# Patient Record
Sex: Male | Born: 1965 | Race: White | Hispanic: No | Marital: Married | Smoking: Never smoker
Health system: Southern US, Community
[De-identification: ages and names within clinical notes are randomized; demographics above are authoritative.]

## PROBLEM LIST (undated history)

## (undated) DIAGNOSIS — I2510 Atherosclerotic heart disease of native coronary artery without angina pectoris: Secondary | ICD-10-CM

## (undated) DIAGNOSIS — K219 Gastro-esophageal reflux disease without esophagitis: Secondary | ICD-10-CM

## (undated) DIAGNOSIS — I10 Essential (primary) hypertension: Secondary | ICD-10-CM

## (undated) DIAGNOSIS — E785 Hyperlipidemia, unspecified: Secondary | ICD-10-CM

## (undated) DIAGNOSIS — I209 Angina pectoris, unspecified: Secondary | ICD-10-CM

## (undated) HISTORY — DX: Hyperlipidemia, unspecified: E78.5

## (undated) HISTORY — DX: Gastro-esophageal reflux disease without esophagitis: K21.9

## (undated) HISTORY — PX: MASTECTOMY: SHX3

---

## 1994-11-29 HISTORY — PX: ANTERIOR CRUCIATE LIGAMENT REPAIR: SHX115

## 2009-06-18 ENCOUNTER — Ambulatory Visit: Payer: Self-pay | Admitting: Internal Medicine

## 2009-06-22 LAB — CONVERTED CEMR LAB
Basophils Relative: 0.8 % (ref 0.0–3.0)
Eosinophils Relative: 3.7 % (ref 0.0–5.0)
HCT: 42.4 % (ref 39.0–52.0)
Lymphs Abs: 1.4 10*3/uL (ref 0.7–4.0)
MCHC: 34.6 g/dL (ref 30.0–36.0)
MCV: 88.5 fL (ref 78.0–100.0)
Monocytes Absolute: 0.5 10*3/uL (ref 0.1–1.0)
Platelets: 190 10*3/uL (ref 150.0–400.0)
WBC: 5.5 10*3/uL (ref 4.5–10.5)

## 2009-06-23 ENCOUNTER — Ambulatory Visit: Payer: Self-pay | Admitting: Internal Medicine

## 2009-06-23 ENCOUNTER — Encounter (INDEPENDENT_AMBULATORY_CARE_PROVIDER_SITE_OTHER): Payer: Self-pay | Admitting: *Deleted

## 2009-06-30 ENCOUNTER — Telehealth (INDEPENDENT_AMBULATORY_CARE_PROVIDER_SITE_OTHER): Payer: Self-pay | Admitting: *Deleted

## 2009-07-28 ENCOUNTER — Encounter (INDEPENDENT_AMBULATORY_CARE_PROVIDER_SITE_OTHER): Payer: Self-pay | Admitting: *Deleted

## 2011-01-18 ENCOUNTER — Institutional Professional Consult (permissible substitution) (INDEPENDENT_AMBULATORY_CARE_PROVIDER_SITE_OTHER): Payer: BC Managed Care – PPO | Admitting: Cardiology

## 2011-01-18 DIAGNOSIS — E785 Hyperlipidemia, unspecified: Secondary | ICD-10-CM

## 2015-05-27 ENCOUNTER — Telehealth: Payer: Self-pay | Admitting: Cardiology

## 2015-05-27 NOTE — Telephone Encounter (Signed)
Ingrid from [facility] was calling to see if the pt could be seen sooner than 9/8 because he was in the office with some exertional chest pain. Please f/u with her   Thanks

## 2015-05-28 ENCOUNTER — Encounter: Payer: Self-pay | Admitting: Interventional Cardiology

## 2015-05-28 ENCOUNTER — Ambulatory Visit (INDEPENDENT_AMBULATORY_CARE_PROVIDER_SITE_OTHER): Payer: BLUE CROSS/BLUE SHIELD | Admitting: Interventional Cardiology

## 2015-05-28 VITALS — BP 128/84 | HR 95 | Ht 72.0 in | Wt 204.8 lb

## 2015-05-28 DIAGNOSIS — K219 Gastro-esophageal reflux disease without esophagitis: Secondary | ICD-10-CM | POA: Diagnosis not present

## 2015-05-28 DIAGNOSIS — I10 Essential (primary) hypertension: Secondary | ICD-10-CM | POA: Diagnosis not present

## 2015-05-28 DIAGNOSIS — R079 Chest pain, unspecified: Secondary | ICD-10-CM

## 2015-05-28 NOTE — Telephone Encounter (Signed)
Returned call to patient no answer.LMTC. 

## 2015-05-28 NOTE — Patient Instructions (Signed)
Medication Instructions:  None  Labwork: None  Testing/Procedures: Your physician has requested that you have a stress echocardiogram. For further information please visit [email or web address]. Please follow instruction sheet as given.   Follow-Up: Your follow up will be based on the results of your stress echocardiogram.   Any Other Special Instructions Will Be Listed Below (If Applicable).

## 2015-05-28 NOTE — Progress Notes (Signed)
Patient ID: Marvin Soto, male   DOB: 30-Sep-1966, 49 y.o.   MRN: [id number]     Cardiology Office Note   Date:  05/28/2015   ID:  Marvin Soto, DOB 30-Sep-1966, MRN [id number]  PCP:  Sissy HoffSWAYNE,DAVID W, MD    No chief complaint on file. chest discomfort   Wt Readings from Last 3 Encounters:  05/28/15 204 lb 12.8 oz (92.897 kg)  06/18/09 208 lb 12.8 oz (94.711 kg)       History of Present Illness: Marvin ShipKeith Skalla is a 49 y.o. male  Who has had HTN.  ARB was increased.  He had a stress echo 10 years ago which was normal.  H ehad dizziness at that time.  His father had an MI at 1046, he had a stent later.  He was a heavy smoker.  He has a few sisters, but no heart issues in their histories.    He has had chest tightness/burning intermittently for a few months.  It occurs when walking and carrying something heavy.  Resolves with rest. He has had some diaphoresis when he has the chest tightness.  He stops walking and it goes away.    He started Vyvanse for ADD in the fall.  The chest pain does not correlate with the start of the medication.  He lost weight with this medication.    In Dec 2015, he hikes [employer] and did not have any chest discomfort.  He feels ok ayaking.  Last performed 4 days ago.     Past Medical History  Diagnosis Date  . GERD (gastroesophageal reflux disease)   . Dyslipidemia     Past Surgical History  Procedure Laterality Date  . Anterior cruciate ligament repair    . Mastectomy       Current Outpatient Prescriptions  Medication Sig Dispense Refill  . Cetirizine HCl (ZYRTEC ALLERGY PO) Take 1 tablet by mouth daily.     Marland Kitchen. losartan (COZAAR) 100 MG tablet Take 100 mg by mouth daily.  5  . VYVANSE 40 MG capsule Take 40 mg by mouth daily.   0   No current facility-administered medications for this visit.    Allergies:   Review of patient's allergies indicates no known allergies.    Social History:  The patient  reports that he has never smoked. He has  never used smokeless tobacco. He reports that he does not drink alcohol.   Family History:  The patient's *family history includes Healthy in his mother, sister, and sister; Heart attack in his father; Multiple sclerosis in his sister. +early CAD.   ROS:  Please see the history of present illness.   Otherwise, review of systems are positive for chest discomfort.   All other systems are reviewed and negative.    PHYSICAL EXAM: VS:  BP 128/84 mmHg  Pulse 95  Ht 6' (1.829 m)  Wt 204 lb 12.8 oz (92.897 kg)  BMI 27.77 kg/m2  SpO2 99% , BMI Body mass index is 27.77 kg/(m^2). GEN: Well nourished, well developed, in no acute distress HEENT: normal Neck: no JVD, carotid bruits, or masses Cardiac: RRR; no murmurs, rubs, or gallops,no edema  Respiratory:  clear to auscultation bilaterally, normal work of breathing GI: soft, nontender, nondistended, + BS MS: no deformity or atrophy, left knee scar Skin: warm and dry, no rash Neuro:  Strength and sensation are intact Psych: euthymic mood, full affect   EKG:   The ekg  demonstrates normal, done by PMD   Recent Labs:  No results found for requested labs within last 365 days.   Lipid Panel No results found for: CHOL, TRIG, HDL, CHOLHDL, VLDL, LDLCALC, LDLDIRECT   Other studies Reviewed: Additional studies/ records that were reviewed today with results demonstrating: old ECG, unable to find old stress test result.   ASSESSMENT AND PLAN:   1. Chest discomfort: Plan stress echocardiogram.  Reasonable to evaluate him for ischemia given his family history of heart disease.  We did briefly discuss angiography given that his symptoms do not occur every time he exerts himself, we opted for stress testing. If the stress test is abnormal, would need to consider angiography at that point. 2. Hypertension: Well controlled on the losartan. This is a good choice given his risk for cardiovascular disease. 3. Dyslipidemia: will obtain most recent lipid  tests from his primary care doctor. Currently controlled with diet and exercise. 4. GERD: Controlled by Nexium. Exertional symptoms did not go away. I doubt that what he is experiencing when walking and carrying something is related to reflux. 5. ADD: Unclear whether this could be playing a part in his cardiac sx.  Watch for arhythmia on the treadmill.  CP started after Vyvanse.   Current medicines are reviewed at length with the patient today.  The patient concerns regarding his medicines were addressed.  The following changes have been made:  No change  Labs/ tests ordered today include: stress echo No orders of the defined types were placed in this encounter.    Recommend 150 minutes/week of aerobic exercise Low fat, low carb, high fiber diet recommended  Disposition:   FU for stress echo   Delorise JacksonSigned, Salley Boxley S., MD  05/28/2015 11:02 AM    [facility] [address], [place], [place]  [address] Phone: [phone]; Fax: [phone]

## 2015-05-30 NOTE — Telephone Encounter (Signed)
Did you speak with Jerene Dillingngrid?

## 2015-05-30 NOTE — Telephone Encounter (Signed)
Returned call to patient he stated someone already called him and he already saw Dr.Varanasi 05/28/15.

## 2015-06-04 ENCOUNTER — Telehealth (HOSPITAL_COMMUNITY): Payer: Self-pay | Admitting: *Deleted

## 2015-06-04 NOTE — Telephone Encounter (Signed)
Left message on voicemail in reference to upcoming appointment scheduled for 06/06/15. Phone number given for a call back so details instructions can be given. Rashi Giuliani J Didi Ganaway, RN 

## 2015-06-04 NOTE — Telephone Encounter (Signed)
Patient given detailed instructions per Stress Test Requisition Sheet for test on 06/06/15 at 730.Patient Notified to arrive 30 minutes early, and that it is imperative to arrive on time for appointment to keep from having the test rescheduled.  Patient verbalized understanding. Antionette CharMary J Emile Ringgenberg, RN

## 2015-06-06 ENCOUNTER — Other Ambulatory Visit (HOSPITAL_COMMUNITY): Payer: BLUE CROSS/BLUE SHIELD

## 2015-06-06 ENCOUNTER — Telehealth: Payer: Self-pay | Admitting: Interventional Cardiology

## 2015-06-06 ENCOUNTER — Ambulatory Visit (HOSPITAL_COMMUNITY): Payer: BLUE CROSS/BLUE SHIELD | Attending: Cardiovascular Disease

## 2015-06-06 ENCOUNTER — Ambulatory Visit (HOSPITAL_BASED_OUTPATIENT_CLINIC_OR_DEPARTMENT_OTHER): Payer: BLUE CROSS/BLUE SHIELD

## 2015-06-06 DIAGNOSIS — R079 Chest pain, unspecified: Secondary | ICD-10-CM | POA: Diagnosis not present

## 2015-06-06 DIAGNOSIS — R9439 Abnormal result of other cardiovascular function study: Secondary | ICD-10-CM | POA: Insufficient documentation

## 2015-06-06 NOTE — Telephone Encounter (Signed)
New Message   Pt is calling because he will be going out of town but want sot talk to Rn about his results

## 2015-06-06 NOTE — Progress Notes (Signed)
Positive stress echo. No symptoms. Paged Dr. Patty SermonsBrackbill with the information. 06/06/2015

## 2015-06-06 NOTE — Telephone Encounter (Signed)
Spoke with pt and he states that he received a call from Dr. Patty SermonsBrackbill about his abnormal myoview. Pt states that he was suppose to leave for vacation tomorrow but Dr. Patty SermonsBrackbill suggested that he didn't so that we can do further testing next week. Pt was able to move vacation back to 7/16-7/23 but will not be able to move it again and doesn't want to lose the money paid. Pt states that he's ready to move forward with any testing that Dr. Eldridge DaceVaranasi feels that he needs even if he has to do it over the weekend. Pt states that Dr. Eldridge DaceVaranasi can call him directly if he would like to at [phone]. Informed pt that I will route this information to Dr. Eldridge DaceVaranasi for review and advisement. Pt verbalized understanding and was in agreement with this plan.

## 2015-06-09 ENCOUNTER — Telehealth: Payer: Self-pay | Admitting: Interventional Cardiology

## 2015-06-09 ENCOUNTER — Other Ambulatory Visit (INDEPENDENT_AMBULATORY_CARE_PROVIDER_SITE_OTHER): Payer: BLUE CROSS/BLUE SHIELD | Admitting: *Deleted

## 2015-06-09 ENCOUNTER — Encounter: Payer: Self-pay | Admitting: *Deleted

## 2015-06-09 DIAGNOSIS — Z01812 Encounter for preprocedural laboratory examination: Secondary | ICD-10-CM

## 2015-06-09 LAB — CBC
HEMATOCRIT: 41.3 % (ref 39.0–52.0)
HEMOGLOBIN: 14.2 g/dL (ref 13.0–17.0)
MCHC: 34.4 g/dL (ref 30.0–36.0)
MCV: 86.7 fl (ref 78.0–100.0)
PLATELETS: 212 10*3/uL (ref 150.0–400.0)
RBC: 4.77 Mil/uL (ref 4.22–5.81)
RDW: 12.8 % (ref 11.5–15.5)
WBC: 5.4 10*3/uL (ref 4.0–10.5)

## 2015-06-09 LAB — BASIC METABOLIC PANEL
BUN: 14 mg/dL (ref 6–23)
CO2: 29 mEq/L (ref 19–32)
CREATININE: 1.02 mg/dL (ref 0.40–1.50)
Calcium: 9.3 mg/dL (ref 8.4–10.5)
Chloride: 103 mEq/L (ref 96–112)
GFR: 82.41 mL/min (ref >60.00)
Glucose, Bld: 90 mg/dL (ref 70–99)
POTASSIUM: 3.7 meq/L (ref 3.5–5.1)
SODIUM: 139 meq/L (ref 135–145)

## 2015-06-09 LAB — PROTIME-INR
INR: 1 ratio (ref 0.8–1.0)
Prothrombin Time: 11 s (ref 9.6–13.1)

## 2015-06-09 MED ORDER — ISOSORBIDE MONONITRATE ER 30 MG PO TB24: 30.0000 mg | ORAL_TABLET | Freq: Every day | ORAL | Status: DC

## 2015-06-09 NOTE — Telephone Encounter (Signed)
New message      Pt had stress test last Friday----someone called and said stress test was abn--he cancelled his vacation---pt is very anxious.  Want to know what to do now

## 2015-06-09 NOTE — Telephone Encounter (Signed)
Spoke with pt and informed him that Dr. Eldridge DaceVaranasi would like for him to have a cath done and to start Imdur 30mg  QD. Went over cath instructions with pt and scheduled labs for today.  Informed pt to pick up letter from front desk when he comes in for his labs today.  Verified pharmacy. Pt verbalized understanding and was in agreement with this plan.

## 2015-06-10 ENCOUNTER — Encounter (HOSPITAL_COMMUNITY)
Admission: RE | Disposition: A | Discharge status: Home / Self Care | Payer: Self-pay | Source: Ambulatory Visit | Attending: Interventional Cardiology

## 2015-06-10 ENCOUNTER — Encounter (HOSPITAL_COMMUNITY): Payer: Self-pay | Admitting: General Practice

## 2015-06-10 ENCOUNTER — Ambulatory Visit (HOSPITAL_COMMUNITY)
Admission: RE | Admit: 2015-06-10 | Discharge: 2015-06-11 | Disposition: A | Discharge status: Home / Self Care | Payer: BLUE CROSS/BLUE SHIELD | Source: Ambulatory Visit | Attending: Interventional Cardiology | Admitting: Interventional Cardiology

## 2015-06-10 DIAGNOSIS — Z9861 Coronary angioplasty status: Secondary | ICD-10-CM | POA: Diagnosis not present

## 2015-06-10 DIAGNOSIS — E785 Hyperlipidemia, unspecified: Secondary | ICD-10-CM | POA: Diagnosis not present

## 2015-06-10 DIAGNOSIS — Z955 Presence of coronary angioplasty implant and graft: Secondary | ICD-10-CM

## 2015-06-10 DIAGNOSIS — I2510 Atherosclerotic heart disease of native coronary artery without angina pectoris: Secondary | ICD-10-CM | POA: Diagnosis present

## 2015-06-10 DIAGNOSIS — R9439 Abnormal result of other cardiovascular function study: Secondary | ICD-10-CM | POA: Diagnosis present

## 2015-06-10 DIAGNOSIS — K219 Gastro-esophageal reflux disease without esophagitis: Secondary | ICD-10-CM | POA: Insufficient documentation

## 2015-06-10 DIAGNOSIS — Z8249 Family history of ischemic heart disease and other diseases of the circulatory system: Secondary | ICD-10-CM | POA: Insufficient documentation

## 2015-06-10 DIAGNOSIS — I10 Essential (primary) hypertension: Secondary | ICD-10-CM

## 2015-06-10 HISTORY — DX: Angina pectoris, unspecified: I20.9

## 2015-06-10 HISTORY — PX: CORONARY ANGIOPLASTY WITH STENT PLACEMENT: SHX49

## 2015-06-10 HISTORY — PX: CARDIAC CATHETERIZATION: SHX172

## 2015-06-10 HISTORY — DX: Atherosclerotic heart disease of native coronary artery without angina pectoris: I25.10

## 2015-06-10 HISTORY — DX: Essential (primary) hypertension: I10

## 2015-06-10 LAB — POCT ACTIVATED CLOTTING TIME: Activated Clotting Time: 485 seconds

## 2015-06-10 SURGERY — LEFT HEART CATH AND CORONARY ANGIOGRAPHY

## 2015-06-10 MED ORDER — MIDAZOLAM HCL 2 MG/2ML IJ SOLN
INTRAMUSCULAR | Status: AC
  Filled 2015-06-10: qty 2

## 2015-06-10 MED ORDER — ACETAMINOPHEN 325 MG PO TABS
650.0000 mg | ORAL_TABLET | ORAL | Status: DC | PRN
  Administered 2015-06-10 (×2): 650 mg via ORAL
  Filled 2015-06-10 (×2): qty 2

## 2015-06-10 MED ORDER — HEPARIN (PORCINE) IN NACL 2-0.9 UNIT/ML-% IJ SOLN
INTRAMUSCULAR | Status: AC
  Filled 2015-06-10: qty 1000

## 2015-06-10 MED ORDER — BIVALIRUDIN 250 MG IV SOLR
INTRAVENOUS | Status: AC
  Filled 2015-06-10: qty 250

## 2015-06-10 MED ORDER — HEPARIN (PORCINE) IN NACL 2-0.9 UNIT/ML-% IJ SOLN
INTRAMUSCULAR | Status: AC
  Filled 2015-06-10: qty 500

## 2015-06-10 MED ORDER — CLOPIDOGREL BISULFATE 300 MG PO TABS
ORAL_TABLET | ORAL | Status: AC
  Filled 2015-06-10: qty 1

## 2015-06-10 MED ORDER — LORATADINE 10 MG PO TABS
10.0000 mg | ORAL_TABLET | Freq: Every day | ORAL | Status: DC
  Administered 2015-06-11: 10 mg via ORAL
  Filled 2015-06-10 (×2): qty 1

## 2015-06-10 MED ORDER — SODIUM CHLORIDE 0.9 % IV SOLN
1.7500 mg/kg/h | INTRAVENOUS | Status: DC
  Filled 2015-06-10: qty 250

## 2015-06-10 MED ORDER — VERAPAMIL HCL 2.5 MG/ML IV SOLN
INTRAVENOUS | Status: DC | PRN
  Administered 2015-06-10: 10:00:00 via INTRA_ARTERIAL

## 2015-06-10 MED ORDER — SODIUM CHLORIDE 0.9 % IJ SOLN
3.0000 mL | Freq: Two times a day (BID) | INTRAMUSCULAR | Status: DC
  Administered 2015-06-10: 18:00:00 3 mL via INTRAVENOUS

## 2015-06-10 MED ORDER — LOSARTAN POTASSIUM 50 MG PO TABS
100.0000 mg | ORAL_TABLET | Freq: Every day | ORAL | Status: DC
  Administered 2015-06-11: 10:00:00 100 mg via ORAL
  Filled 2015-06-10 (×2): qty 2

## 2015-06-10 MED ORDER — SODIUM CHLORIDE 0.9 % IV SOLN: 250.0000 mL | INTRAVENOUS | Status: DC | PRN

## 2015-06-10 MED ORDER — ATORVASTATIN CALCIUM 10 MG PO TABS
10.0000 mg | ORAL_TABLET | Freq: Every day | ORAL | Status: DC
  Administered 2015-06-10: 18:00:00 10 mg via ORAL
  Filled 2015-06-10: qty 1

## 2015-06-10 MED ORDER — NITROGLYCERIN 1 MG/10 ML FOR IR/CATH LAB
INTRA_ARTERIAL | Status: DC | PRN
  Administered 2015-06-10: 10:00:00

## 2015-06-10 MED ORDER — SODIUM CHLORIDE 0.9 % WEIGHT BASED INFUSION
3.0000 mL/kg/h | INTRAVENOUS | Status: AC
Start: 2015-06-10 — End: 2015-06-10
  Administered 2015-06-10: 12:00:00 3 mL/kg/h via INTRAVENOUS

## 2015-06-10 MED ORDER — BIVALIRUDIN BOLUS VIA INFUSION - CUPID
INTRAVENOUS | Status: DC | PRN
  Administered 2015-06-10: 69.75 mg via INTRAVENOUS

## 2015-06-10 MED ORDER — SODIUM CHLORIDE 0.9 % IJ SOLN: 3.0000 mL | INTRAMUSCULAR | Status: DC | PRN

## 2015-06-10 MED ORDER — ASPIRIN 81 MG PO CHEW
81.0000 mg | CHEWABLE_TABLET | Freq: Every day | ORAL | Status: DC
  Administered 2015-06-11: 10:00:00 81 mg via ORAL
  Filled 2015-06-10: qty 1

## 2015-06-10 MED ORDER — ONDANSETRON HCL 4 MG/2ML IJ SOLN: 4.0000 mg | Freq: Four times a day (QID) | INTRAMUSCULAR | Status: DC | PRN

## 2015-06-10 MED ORDER — VERAPAMIL HCL 2.5 MG/ML IV SOLN
INTRAVENOUS | Status: AC
  Filled 2015-06-10: qty 2

## 2015-06-10 MED ORDER — PANTOPRAZOLE SODIUM 40 MG PO TBEC
40.0000 mg | DELAYED_RELEASE_TABLET | Freq: Every day | ORAL | Status: DC
  Administered 2015-06-11: 40 mg via ORAL
  Filled 2015-06-10 (×2): qty 1

## 2015-06-10 MED ORDER — MIDAZOLAM HCL 2 MG/2ML IJ SOLN
INTRAMUSCULAR | Status: DC | PRN
  Administered 2015-06-10: 1 mg via INTRAVENOUS
  Administered 2015-06-10: 2 mg via INTRAVENOUS

## 2015-06-10 MED ORDER — SODIUM CHLORIDE 0.9 % IJ SOLN: 3.0000 mL | Freq: Two times a day (BID) | INTRAMUSCULAR | Status: DC

## 2015-06-10 MED ORDER — IOHEXOL 350 MG/ML SOLN
INTRAVENOUS | Status: DC | PRN
  Administered 2015-06-10: 155 mL via INTRACARDIAC

## 2015-06-10 MED ORDER — NITROGLYCERIN 1 MG/10 ML FOR IR/CATH LAB
INTRA_ARTERIAL | Status: AC
  Filled 2015-06-10: qty 10

## 2015-06-10 MED ORDER — HEPARIN SODIUM (PORCINE) 1000 UNIT/ML IJ SOLN
INTRAMUSCULAR | Status: AC
  Filled 2015-06-10: qty 1

## 2015-06-10 MED ORDER — LISDEXAMFETAMINE DIMESYLATE 20 MG PO CAPS
40.0000 mg | ORAL_CAPSULE | Freq: Every day | ORAL | Status: DC
  Filled 2015-06-10: qty 2

## 2015-06-10 MED ORDER — ASPIRIN 81 MG PO TABS: 81.0000 mg | ORAL_TABLET | Freq: Every day | ORAL | Status: DC

## 2015-06-10 MED ORDER — SODIUM CHLORIDE 0.9 % WEIGHT BASED INFUSION: 1.0000 mL/kg/h | INTRAVENOUS | Status: DC

## 2015-06-10 MED ORDER — FENTANYL CITRATE (PF) 100 MCG/2ML IJ SOLN
INTRAMUSCULAR | Status: AC
  Filled 2015-06-10: qty 2

## 2015-06-10 MED ORDER — NITROGLYCERIN 1 MG/10 ML FOR IR/CATH LAB
INTRA_ARTERIAL | Status: DC | PRN
  Administered 2015-06-10: 200 ug via INTRA_ARTERIAL
  Administered 2015-06-10: 200 ug via INTRACORONARY
  Administered 2015-06-10: 200 ug via INTRA_ARTERIAL

## 2015-06-10 MED ORDER — CLOPIDOGREL BISULFATE 75 MG PO TABS
75.0000 mg | ORAL_TABLET | Freq: Every day | ORAL | Status: DC
  Administered 2015-06-11: 75 mg via ORAL
  Filled 2015-06-10: qty 1

## 2015-06-10 MED ORDER — LIDOCAINE HCL (PF) 1 % IJ SOLN
INTRAMUSCULAR | Status: AC
  Filled 2015-06-10: qty 30

## 2015-06-10 MED ORDER — CLOPIDOGREL BISULFATE 300 MG PO TABS
ORAL_TABLET | ORAL | Status: DC | PRN
  Administered 2015-06-10: 600 mg via ORAL

## 2015-06-10 MED ORDER — SODIUM CHLORIDE 0.9 % WEIGHT BASED INFUSION
3.0000 mL/kg/h | INTRAVENOUS | Status: DC
  Administered 2015-06-10: 3 mL/kg/h via INTRAVENOUS

## 2015-06-10 MED ORDER — SODIUM CHLORIDE 0.9 % IV SOLN
250.0000 mg | INTRAVENOUS | Status: DC | PRN
  Administered 2015-06-10: 1.75 mg/kg/h via INTRAVENOUS

## 2015-06-10 MED ORDER — LIDOCAINE HCL (PF) 1 % IJ SOLN
INTRAMUSCULAR | Status: DC | PRN
  Administered 2015-06-10: 2 mL via SUBCUTANEOUS

## 2015-06-10 MED ORDER — HEPARIN SODIUM (PORCINE) 1000 UNIT/ML IJ SOLN
INTRAMUSCULAR | Status: DC | PRN
  Administered 2015-06-10: 4500 [IU] via INTRAVENOUS

## 2015-06-10 MED ORDER — FENTANYL CITRATE (PF) 100 MCG/2ML IJ SOLN
INTRAMUSCULAR | Status: DC | PRN
  Administered 2015-06-10 (×2): 25 ug via INTRAVENOUS

## 2015-06-10 MED ORDER — ASPIRIN 81 MG PO CHEW: 81.0000 mg | CHEWABLE_TABLET | ORAL | Status: DC

## 2015-06-10 SURGICAL SUPPLY — 27 items
BALLN EUPHORA RX 2.5X15 (BALLOONS) ×3
BALLN ~~LOC~~ EUPHORA RX 4.0X15 (BALLOONS) ×3
BALLN ~~LOC~~ TREK RX 4.0X12 (BALLOONS) ×3
BALLOON EUPHORA RX 2.5X15 (BALLOONS) ×1 IMPLANT
BALLOON ~~LOC~~ EUPHORA RX 4.0X15 (BALLOONS) ×1 IMPLANT
BALLOON ~~LOC~~ TREK RX 4.0X12 (BALLOONS) ×1 IMPLANT
CATH INFINITI 5 FR AR1 MOD (CATHETERS) ×3 IMPLANT
CATH INFINITI 5 FR JL3.5 (CATHETERS) ×3 IMPLANT
CATH INFINITI 5FR ANG PIGTAIL (CATHETERS) ×3 IMPLANT
CATH INFINITI JR4 5F (CATHETERS) ×3 IMPLANT
CATH LAUNCHER 6FR 3DRIGHT (CATHETERS) ×1 IMPLANT
CATH LAUNCHER 6FR AL1 (CATHETERS) ×1 IMPLANT
CATHETER LAUNCHER 6FR 3DRIGHT (CATHETERS) ×3
CATHETER LAUNCHER 6FR AL1 (CATHETERS) ×3
DEVICE RAD COMP TR BAND LRG (VASCULAR PRODUCTS) ×3 IMPLANT
GLIDESHEATH SLEND SS 6F .021 (SHEATH) ×3 IMPLANT
KIT ENCORE 26 ADVANTAGE (KITS) ×3 IMPLANT
KIT HEART LEFT (KITS) ×3 IMPLANT
PACK CARDIAC CATHETERIZATION (CUSTOM PROCEDURE TRAY) ×3 IMPLANT
STENT RESOLUTE INTEG 4.0X22 (Permanent Stent) ×3 IMPLANT
SYR MEDRAD MARK V 150ML (SYRINGE) ×3 IMPLANT
TRANSDUCER W/STOPCOCK (MISCELLANEOUS) ×3 IMPLANT
TUBING CIL FLEX 10 FLL-RA (TUBING) ×3 IMPLANT
VALVE GUARDIAN II ~~LOC~~ HEMO (MISCELLANEOUS) ×3 IMPLANT
WIRE ASAHI PROWATER 180CM (WIRE) ×3 IMPLANT
WIRE HI TORQ BMW 190CM (WIRE) ×3 IMPLANT
WIRE SAFE-T 1.5MM-J .035X260CM (WIRE) ×3 IMPLANT

## 2015-06-10 NOTE — Progress Notes (Signed)
TR BAND REMOVAL  LOCATION:  right radial  DEFLATED PER PROTOCOL:  Yes.    TIME BAND OFF / DRESSING APPLIED:   1530   SITE UPON ARRIVAL:   Level 0  SITE AFTER BAND REMOVAL:  Level 0  REVERSE ALLEN'S TEST:    positive  CIRCULATION SENSATION AND MOVEMENT:  Within Normal Limits  Yes.    COMMENTS:    

## 2015-06-10 NOTE — Interval H&P Note (Signed)
Cath Lab Visit (complete for each Cath Lab visit)  Clinical Evaluation Leading to the Procedure:   ACS: No.  Non-ACS:    Anginal Classification: CCS III  Anti-ischemic medical therapy: Minimal Therapy (1 class of medications)  Non-Invasive Test Results: Intermediate-risk stress test findings: cardiac mortality 1-3%/year  Prior CABG: No previous CABG   Ischemic Symptoms? CCS III (Marked limitation of ordinary activity) Anti-ischemic Medical Therapy? Minimal Therapy (1 class of medications) Non-invasive Test Results? Intermediate-risk stress test findings: cardiac mortality 1-3%/year Prior CABG? No Previous CABG   Patient Information:   1-2V CAD, no prox LAD  U (6)  Indication: 16; Score: 6   Patient Information:   CTO of 1 vessel, no other CAD  U (6)  Indication: 26; Score: 6   Patient Information:   1V CAD with prox LAD  A (7)  Indication: 32; Score: 7   Patient Information:   2V-CAD with prox LAD  A (8)  Indication: 38; Score: 8   Patient Information:   3V-CAD without LMCA  A (8)  Indication: 44; Score: 8   Patient Information:   3V-CAD without LMCA With Abnormal LV systolic function  A (9)  Indication: 48; Score: 9   Patient Information:   LMCA-CAD  A (9)  Indication: 49; Score: 9   Patient Information:   2V-CAD with prox LAD PCI  A (7)  Indication: 62; Score: 7   Patient Information:   2V-CAD with prox LAD CABG  A (8)  Indication: 62; Score: 8   Patient Information:   3V-CAD without LMCA With Low CAD burden(i.e., 3 focal stenoses, low SYNTAX score) PCI  A (7)  Indication: 63; Score: 7   Patient Information:   3V-CAD without LMCA With Low CAD burden(i.e., 3 focal stenoses, low SYNTAX score) CABG  A (9)  Indication: 63; Score: 9   Patient Information:   3V-CAD without LMCA E06c - Intermediate-high CAD burden (i.e., multiple diffuse lesions, presence of CTO, or high SYNTAX score) PCI  U (4)  Indication:  64; Score: 4   Patient Information:   3V-CAD without LMCA E06c - Intermediate-high CAD burden (i.e., multiple diffuse lesions, presence of CTO, or high SYNTAX score) CABG  A (9)  Indication: 64; Score: 9   Patient Information:   LMCA-CAD With Isolated LMCA stenosis  PCI  U (6)  Indication: 65; Score: 6   Patient Information:   LMCA-CAD Additional CAD, low CAD burden (i.e., 1- to 2-vessel additional involvement, low SYNTAX score) PCI  U (5)  Indication: 66; Score: 5   Patient Information:   LMCA-CAD Additional CAD, low CAD burden (i.e., 1- to 2-vessel additional involvement, low SYNTAX score) CABG  A (9)  Indication: 66; Score: 9   Patient Information:   LMCA-CAD With Isolated LMCA stenosis  CABG  A (9)  Indication: 66; Score: 9   Patient Information:   LMCA-CAD Additional CAD, intermediate-high CAD burden (i.e., 3-vessel involvement, presence of CTO, or high SYNTAX score) PCI  I (3)  Indication: 67; Score: 3   Patient Information:   LMCA-CAD Additional CAD, intermediate-high CAD burden (i.e., 3-vessel involvement, presence of CTO, or high SYNTAX score) CABG  A (9)  Indication: 67; Score: 9   Patient with chest discomfort at low level exercise during stress test.    History and Physical Interval Note:  06/10/2015 10:03 AM  Marvin Soto  has presented today for surgery, with the diagnosis of chest discomfort/abnormal stress echo  The various methods of treatment have been discussed  with the patient and family. After consideration of risks, benefits and other options for treatment, the patient has consented to  Procedure(s): Left Heart Cath and Coronary Angiography (N/A) as a surgical intervention .  The patient's history has been reviewed, patient examined, no change in status, stable for surgery.  I have reviewed the patient's chart and labs.  Questions were answered to the patient's satisfaction.     Annalysse Shoemaker S.

## 2015-06-10 NOTE — Telephone Encounter (Signed)
Pt had left heart cath today.

## 2015-06-10 NOTE — H&P (View-Only) (Signed)
Patient ID: Marvin Soto Krol, male   DOB: 30-Sep-1966, 49 y.o.   MRN: [id number]     Cardiology Office Note   Date:  05/28/2015   ID:  Marvin Soto Robart, DOB 30-Sep-1966, MRN [id number]  PCP:  Sissy HoffSWAYNE,DAVID W, MD    No chief complaint on file. chest discomfort   Wt Readings from Last 3 Encounters:  05/28/15 204 lb 12.8 oz (92.897 kg)  06/18/09 208 lb 12.8 oz (94.711 kg)       History of Present Illness: Marvin Soto Skalla is a 49 y.o. male  Who has had HTN.  ARB was increased.  He had a stress echo 10 years ago which was normal.  H ehad dizziness at that time.  His father had an MI at 1046, he had a stent later.  He was a heavy smoker.  He has a few sisters, but no heart issues in their histories.    He has had chest tightness/burning intermittently for a few months.  It occurs when walking and carrying something heavy.  Resolves with rest. He has had some diaphoresis when he has the chest tightness.  He stops walking and it goes away.    He started Vyvanse for ADD in the fall.  The chest pain does not correlate with the start of the medication.  He lost weight with this medication.    In Dec 2015, he hikes [employer] and did not have any chest discomfort.  He feels ok ayaking.  Last performed 4 days ago.     Past Medical History  Diagnosis Date  . GERD (gastroesophageal reflux disease)   . Dyslipidemia     Past Surgical History  Procedure Laterality Date  . Anterior cruciate ligament repair    . Mastectomy       Current Outpatient Prescriptions  Medication Sig Dispense Refill  . Cetirizine HCl (ZYRTEC ALLERGY PO) Take 1 tablet by mouth daily.     Marland Kitchen. losartan (COZAAR) 100 MG tablet Take 100 mg by mouth daily.  5  . VYVANSE 40 MG capsule Take 40 mg by mouth daily.   0   No current facility-administered medications for this visit.    Allergies:   Review of patient's allergies indicates no known allergies.    Social History:  The patient  reports that he has never smoked. He has  never used smokeless tobacco. He reports that he does not drink alcohol.   Family History:  The patient's *family history includes Healthy in his mother, sister, and sister; Heart attack in his father; Multiple sclerosis in his sister. +early CAD.   ROS:  Please see the history of present illness.   Otherwise, review of systems are positive for chest discomfort.   All other systems are reviewed and negative.    PHYSICAL EXAM: VS:  BP 128/84 mmHg  Pulse 95  Ht 6' (1.829 m)  Wt 204 lb 12.8 oz (92.897 kg)  BMI 27.77 kg/m2  SpO2 99% , BMI Body mass index is 27.77 kg/(m^2). GEN: Well nourished, well developed, in no acute distress HEENT: normal Neck: no JVD, carotid bruits, or masses Cardiac: RRR; no murmurs, rubs, or gallops,no edema  Respiratory:  clear to auscultation bilaterally, normal work of breathing GI: soft, nontender, nondistended, + BS MS: no deformity or atrophy, left knee scar Skin: warm and dry, no rash Neuro:  Strength and sensation are intact Psych: euthymic mood, full affect   EKG:   The ekg  demonstrates normal, done by PMD   Recent Labs:  No results found for requested labs within last 365 days.   Lipid Panel No results found for: CHOL, TRIG, HDL, CHOLHDL, VLDL, LDLCALC, LDLDIRECT   Other studies Reviewed: Additional studies/ records that were reviewed today with results demonstrating: old ECG, unable to find old stress test result.   ASSESSMENT AND PLAN:   1. Chest discomfort: Plan stress echocardiogram.  Reasonable to evaluate him for ischemia given his family history of heart disease.  We did briefly discuss angiography given that his symptoms do not occur every time he exerts himself, we opted for stress testing. If the stress test is abnormal, would need to consider angiography at that point. 2. Hypertension: Well controlled on the losartan. This is a good choice given his risk for cardiovascular disease. 3. Dyslipidemia: will obtain most recent lipid  tests from his primary care doctor. Currently controlled with diet and exercise. 4. GERD: Controlled by Nexium. Exertional symptoms did not go away. I doubt that what he is experiencing when walking and carrying something is related to reflux. 5. ADD: Unclear whether this could be playing a part in his cardiac sx.  Watch for arhythmia on the treadmill.  CP started after Vyvanse.   Current medicines are reviewed at length with the patient today.  The patient concerns regarding his medicines were addressed.  The following changes have been made:  No change  Labs/ tests ordered today include: stress echo No orders of the defined types were placed in this encounter.    Recommend 150 minutes/week of aerobic exercise Low fat, low carb, high fiber diet recommended  Disposition:   FU for stress echo   Delorise JacksonSigned, Arnika Larzelere S., MD  05/28/2015 11:02 AM    [facility] [address], [place], [place]  [address] Phone: [phone]; Fax: [phone]

## 2015-06-11 DIAGNOSIS — Z9861 Coronary angioplasty status: Secondary | ICD-10-CM | POA: Diagnosis not present

## 2015-06-11 DIAGNOSIS — Z8249 Family history of ischemic heart disease and other diseases of the circulatory system: Secondary | ICD-10-CM | POA: Diagnosis not present

## 2015-06-11 DIAGNOSIS — I10 Essential (primary) hypertension: Secondary | ICD-10-CM | POA: Diagnosis not present

## 2015-06-11 DIAGNOSIS — I2510 Atherosclerotic heart disease of native coronary artery without angina pectoris: Secondary | ICD-10-CM

## 2015-06-11 DIAGNOSIS — E785 Hyperlipidemia, unspecified: Secondary | ICD-10-CM | POA: Diagnosis not present

## 2015-06-11 DIAGNOSIS — R9439 Abnormal result of other cardiovascular function study: Secondary | ICD-10-CM

## 2015-06-11 LAB — BASIC METABOLIC PANEL
Anion gap: 5 (ref 5–15)
BUN: 8 mg/dL (ref 6–20)
CHLORIDE: 107 mmol/L (ref 101–111)
CO2: 29 mmol/L (ref 22–32)
CREATININE: 1.08 mg/dL (ref 0.61–1.24)
Calcium: 8.9 mg/dL (ref 8.9–10.3)
GFR calc Af Amer: >60 mL/min (ref >60)
GFR calc non Af Amer: >60 mL/min (ref >60)
Glucose, Bld: 107 mg/dL — ABNORMAL HIGH (ref 65–99)
Potassium: 4.5 mmol/L (ref 3.5–5.1)
Sodium: 141 mmol/L (ref 135–145)

## 2015-06-11 LAB — CBC
HEMATOCRIT: 37.9 % — AB (ref 39.0–52.0)
HEMOGLOBIN: 12.9 g/dL — AB (ref 13.0–17.0)
MCH: 29.4 pg (ref 26.0–34.0)
MCHC: 34 g/dL (ref 30.0–36.0)
MCV: 86.3 fL (ref 78.0–100.0)
Platelets: 194 10*3/uL (ref 150–400)
RBC: 4.39 MIL/uL (ref 4.22–5.81)
RDW: 12.7 % (ref 11.5–15.5)
WBC: 6.7 10*3/uL (ref 4.0–10.5)

## 2015-06-11 MED ORDER — ATORVASTATIN CALCIUM 10 MG PO TABS: 10.0000 mg | ORAL_TABLET | Freq: Every day | ORAL | Status: DC

## 2015-06-11 MED ORDER — METOPROLOL TARTRATE 25 MG PO TABS: 12.5000 mg | ORAL_TABLET | Freq: Two times a day (BID) | ORAL | Status: DC

## 2015-06-11 MED ORDER — PANTOPRAZOLE SODIUM 40 MG PO TBEC: 40.0000 mg | DELAYED_RELEASE_TABLET | Freq: Every day | ORAL | Status: DC

## 2015-06-11 MED ORDER — CLOPIDOGREL BISULFATE 75 MG PO TABS: 75.0000 mg | ORAL_TABLET | Freq: Every day | ORAL | Status: DC

## 2015-06-11 NOTE — Research (Signed)
[place] Study Informed Consent   Subject Name: Marvin Soto  Subject met inclusion and exclusion criteria.  The informed consent form, study requirements and expectations were reviewed with the subject and questions and concerns were addressed prior to the signing of the consent form.  The subject verbalized understanding of the trail requirements.  The subject agreed to participate in the [place] and signed the informed consent.  The informed consent was obtained prior to performance of any protocol-specific procedures for the subject.  A copy of the signed informed consent was given to the subject and a copy was placed in the subject's medical record.  Blossom Hoops 06/11/2015, 9:39 AM

## 2015-06-11 NOTE — Progress Notes (Signed)
Patient Profile: 49 y/o male with h/o HTN, and recent h/o chest pain and abnormal stress test, admitted for elective LHC.  Subjective: No complaints. Denies CP/ dyspnea. No difficulties ambulating. Right radial cath site free of complications.   Objective: Vital signs in last 24 hours: Temp:  [97.7 F (36.5 C)-98.4 F (36.9 C)] 97.8 F (36.6 C) (07/13 3716) Pulse Rate:  [0-100] 72 (07/13 0614) Resp:  [0-20] 15 (07/13 0614) BP: (115-161)/(52-94) 122/65 mmHg (07/13 0614) SpO2:  [0 %-100 %] 97 % (07/13 0614) Weight:  [205 lb (92.987 kg)-205 lb 11 oz (93.3 kg)] 205 lb 11 oz (93.3 kg) (07/13 0024) Last BM Date: 06/09/15  Intake/Output from previous day: 07/12 0701 - 07/13 0700 In: 1677 [P.O.:840; I.V.:837] Out: 3000 [Urine:3000] Intake/Output this shift: Total I/O In: -  Out: 1300 [Urine:1300]  Medications Current Facility-Administered Medications  Medication Dose Route Frequency Provider Last Rate Last Dose  . 0.9 %  sodium chloride infusion  250 mL Intravenous PRN Jettie Booze, MD      . acetaminophen (TYLENOL) tablet 650 mg  650 mg Oral Q4H PRN Jettie Booze, MD   650 mg at 06/10/15 2008  . aspirin chewable tablet 81 mg  81 mg Oral Daily Jettie Booze, MD   0 mg at 06/10/15 1215  . atorvastatin (LIPITOR) tablet 10 mg  10 mg Oral q1800 Jettie Booze, MD   10 mg at 06/10/15 1820  . bivalirudin (ANGIOMAX) 250 mg in sodium chloride 0.9 % 50 mL (5 mg/mL) infusion  1.75 mg/kg/hr Intravenous Continuous Jettie Booze, MD   1.75 mg/kg/hr at 06/10/15 1315  . clopidogrel (PLAVIX) tablet 75 mg  75 mg Oral Q breakfast Jettie Booze, MD      . lisdexamfetamine (VYVANSE) capsule 40 mg  40 mg Oral Daily Jettie Booze, MD   40 mg at 06/10/15 1321  . loratadine (CLARITIN) tablet 10 mg  10 mg Oral Daily Jettie Booze, MD   0 mg at 06/10/15 1213  . losartan (COZAAR) tablet 100 mg  100 mg Oral Daily Jettie Booze, MD   0 mg at 06/10/15 1214   . ondansetron (ZOFRAN) injection 4 mg  4 mg Intravenous Q6H PRN Jettie Booze, MD      . pantoprazole (PROTONIX) EC tablet 40 mg  40 mg Oral Q0600 Jettie Booze, MD   40 mg at 06/11/15 9678  . sodium chloride 0.9 % injection 3 mL  3 mL Intravenous Q12H Jettie Booze, MD   3 mL at 06/10/15 1821  . sodium chloride 0.9 % injection 3 mL  3 mL Intravenous PRN Jettie Booze, MD        PE: General appearance: alert, cooperative and no distress Neck: no carotid bruit and no JVD Lungs: clear to auscultation bilaterally Heart: regular rate and rhythm, S1, S2 normal, no murmur, click, rub or gallop Extremities: no LEE Pulses: 2+ and symmetric Skin: warm and dry Neurologic: Grossly normal  Lab Results:   Recent Labs  06/09/15 1111 06/11/15 0249  WBC 5.4 6.7  HGB 14.2 12.9*  HCT 41.3 37.9*  PLT 212.0 194   BMET  Recent Labs  06/09/15 1111 06/11/15 0249  NA 139 141  K 3.7 4.5  CL 103 107  CO2 29 29  GLUCOSE 90 107*  BUN 14 8  CREATININE 1.02 1.08  CALCIUM 9.3 8.9   PT/INR  Recent Labs  06/09/15 1111  LABPROT 11.0  INR  1.0    Studies/Results: LHC 06/10/15  Coronary Findings    Dominance: Co-dominant   Left Anterior Descending  The vessel is large .   Marland Kitchen Mid LAD-1 lesion, 30% stenosed.   . Mid LAD-2 lesion, 95% stenosed.   Marland Kitchen PCI: The pre-interventional distal flow is normal (TIMI 3). Pre-stent angioplasty was performed. A drug-eluting stent was placed. The strut is apposed. Post-stent angioplasty was performed. Maximum pressure: 18 atm. The post-interventional distal flow is normal (TIMI 3). The intervention was successful. No complications occurred at this lesion.  . Supplies used: STENT RESOLUTE [employer]TE[employer] 4.0X22; BALLOON [place] RX [id number]  . There is no residual stenosis post intervention.     Jorene Minors LAD lesion, 25% stenosed.   . First Diagonal Branch   . Ost 1st Diag to 1st Diag lesion, 20% stenosed.   . Second Diagonal Branch   The vessel  is small in size.   . Third Diagonal Branch   The vessel is small in size.     Left Circumflex   . Mid Cx lesion, 25% stenosed.   . First Obtuse Marginal Branch   The vessel is small in size.     Right Coronary Artery   . Prox RCA lesion, 25% stenosed.         Assessment/Plan  Active Problems:   Abnormal stress test   CAD S/P percutaneous coronary angioplasty- DES to mid LAD 06/10/15   HTN (hypertension)    1. CAD: day 1 s/p PCI + DES to 95% mid LAD. Normal LVEF at 55-65%. CP free. Ambulating w/o difficulty. BP stable. Continue DAPT with ASA + Plavix. Continue Lipitor and Losartan. Will add low dose BB given elevated HR with ambulation (120s). Resting HR in the 70s.   2. HTN: BP controlled.   3. Dispo: d/c home today after MD sees. F/u with Dr. Irish Lack.   Brittainy M. Ladoris Gene 06/11/2015 6:49 AM  I have examined the patient and reviewed assessment and plan and discussed with patient.  Agree with above as stated.  Stressed importance of DAPT for a year. If he does oK with rehab, ok for d/c later today.  Started atorvastatin as well.     VARANASI,JAYADEEP S.

## 2015-06-11 NOTE — Progress Notes (Addendum)
CARDIAC REHAB PHASE I   PRE:  Rate/Rhythm: 84 SR  BP:  Sitting: 135/84        SaO2: 98 RA  MODE:  Ambulation: 1000 ft   POST:  Rate/Rhythm: 102 ST  BP:  Sitting: 164/91         SaO2: 100 RA  Pt ambulated 1000 ft on RA, indepenent, steady gait, tolerated well.  Pt denies cp, dizziness, DOE, declined rest stop. Completed stent education with pt wife at bedside.  Reviewed risk factors, anti-platelet therapy, stent card, activity restrictions, ntg, exercise, heart healthy diet, and phase 2 cardiac rehab. Pt verbalized understanding. Pt agrees to phase 2 cardiac rehab. [facility] Pt to chair after walk, call bell within reach.      [phone]  Joylene GrapesMonge, Lanelle Lindo C, RN, BSN 06/11/2015 9:06 AM

## 2015-06-11 NOTE — Progress Notes (Signed)
Heart rate elevated up to 120's with activity. I will continue to monitor and verify with MD about any beta blocker.

## 2015-06-11 NOTE — Discharge Summary (Signed)
Physician Discharge Summary  Patient ID: Marvin Soto MRN: [id number] DOB/AGE: 1966-11-13 49 y.o.   Primary Cardiologist: Dr. Irish Lack  Admit date: 06/10/2015 Discharge date: 06/11/2015  Admission Diagnoses: Chest Pain/ Abnormal Stress Test  Discharge Diagnoses:  Active Problems:   Abnormal stress test   CAD S/P percutaneous coronary angioplasty- DES to mid LAD 06/10/15   HTN (hypertension)   Discharged Condition: stable  Hospital Course: 49 y/o male with h/o HTN and family h/o CAD (father with MI at age 3), admitted for elective LHC after undergoing an abnormal NST conducted for chest pain eval. He presented to [facility] on 06/10/15 for the planned procedure, performed by Dr. Irish Lack. Access was obtained via the right radial artery. He was found to have a 95% stenosed mid LAD, successfully treated with PCI + DES. LVF was normal with EF of 55-65%. He left the cath lab in stable condition. He was placed on DAPT with ASA + Plavix. He was also continued on a statin and ARB. Low dose BB therapy with Lopressor was added. His Nexium was changed to Protonix given concomitant use of Plavix. Imdur was discontinued as it was felt his angina will resolve post PCI. He was monitored overnight and had no post cath complications. He denied any further chest pain and had no difficulties/ symptoms with ambulation. His right radial cath site remained stable. Vital signs and renal function remained stable. He was last seen and examined by Dr. Irish Lack and was felt to be stable from a cardiac standpoint. Post hospital f/u has been arranged 06/25/15.   Consults: None  Significant Diagnostic Studies:  LHC 06/10/15 Coronary Findings     Dominance: Co-dominant    Left Anterior Descending   The vessel is large .    Marland Kitchen Mid LAD-1 lesion, 30% stenosed.    . Mid LAD-2 lesion, 95% stenosed.    Marland Kitchen PCI: The pre-interventional distal flow is normal (TIMI 3). Pre-stent angioplasty was performed. A drug-eluting stent was  placed. The strut is apposed. Post-stent angioplasty was performed. Maximum pressure: 18 atm. The post-interventional distal flow is normal (TIMI 3). The intervention was successful. No complications occurred at this lesion.    . Supplies used: STENT [employer] 4.0X22; BALLOON [employer] [place] RX [id number]    . There is no residual stenosis post intervention.    Jorene Minors LAD lesion, 25% stenosed.    . First Diagonal Branch    . Ost 1st Diag to 1st Diag lesion, 20% stenosed.    . Second Diagonal Branch    The vessel is small in size.    . Third Diagonal Branch    The vessel is small in size.   Left Circumflex     . Mid Cx lesion, 25% stenosed.     . First Obtuse Marginal Branch     The vessel is small in size.    Right Coronary Artery     . Prox RCA lesion, 25% stenosed     Treatments: See Hospital Course  Discharge Exam: Blood pressure 135/82, pulse 85, temperature 97.7 F (36.5 C), temperature source Oral, resp. rate 13, height 6' (1.829 m), weight 205 lb 11 oz (93.3 kg), SpO2 99 %.   Disposition: Final discharge disposition not confirmed      Discharge Instructions    Amb Referral to Cardiac Rehabilitation    Complete by:  As directed   Congestive Heart Failure: If diagnosis is Heart Failure, patient MUST meet each of the CMS criteria: 1. Left Ventricular Ejection Fraction </=  35% 2. NYHA class II-IV symptoms despite being on optimal heart failure therapy for at least 6 weeks. 3. Stable = have not had a recent (<6 weeks) or planned (<6 months) major cardiovascular hospitalization or procedure  Program Details: - Physician supervised classes - 1-3 classes per week over a 12-18 week period, generally for a total of 36 sessions  Physician Certification: I certify that the above Cardiac Rehabilitation treatment is medically necessary and is medically approved by me for treatment of this patient. The patient is willing and cooperative, able to ambulate and medically stable to participate  in exercise rehabilitation. The participant's progress and Individualized Treatment Plan will be reviewed by the Medical Director, Cardiac Rehab staff and as indicated by the Referring/Ordering Physician.  Diagnosis:  PCI     Diet - low sodium heart healthy    Complete by:  As directed      Increase activity slowly    Complete by:  As directed             Medication List    STOP taking these medications        esomeprazole 40 MG capsule  Commonly known as:  NEXIUM     isosorbide mononitrate 30 MG 24 hr tablet  Commonly known as:  IMDUR      TAKE these medications        aspirin 81 MG tablet  Take 81 mg by mouth daily.     atorvastatin 10 MG tablet  Commonly known as:  LIPITOR  Take 1 tablet (10 mg total) by mouth daily at 6 PM.     clopidogrel 75 MG tablet  Commonly known as:  PLAVIX  Take 1 tablet (75 mg total) by mouth daily with breakfast.     losartan 100 MG tablet  Commonly known as:  COZAAR  Take 100 mg by mouth daily.     metoprolol tartrate 25 MG tablet  Commonly known as:  LOPRESSOR  Take 0.5 tablets (12.5 mg total) by mouth 2 (two) times daily.     pantoprazole 40 MG tablet  Commonly known as:  PROTONIX  Take 1 tablet (40 mg total) by mouth daily at 6 (six) AM.     VYVANSE 40 MG capsule  Generic drug:  lisdexamfetamine  Take 40 mg by mouth daily.     cetirizine 10 MG tablet  Commonly known as:  ZYRTEC  Take 10 mg by mouth daily.     ZYRTEC ALLERGY PO  Take 1 tablet by mouth daily.       Follow-up Information    Follow up with Erlene Quan, PA-C On 06/25/2015.   Specialties:  Cardiology, Radiology   Why:  10:00 am (Dr. Hassell Done PA)   Contact information:   [address] [place] [address] [phone]      [place]: > 30 MINUTES  Signed: Lyda Jester 06/11/2015, 11:40 AM  I have examined the patient and reviewed assessment and plan and discussed with patient.  Agree with  above as stated.  S/p PCI to the LAD.  Stressed importance of DAPT.  He was told to take it easy on his wrist for the next week.  Statin started.   Low dose beta blocker started as well.    Yanin Muhlestein S.

## 2015-06-12 ENCOUNTER — Encounter: Payer: Self-pay | Admitting: Interventional Cardiology

## 2015-06-25 ENCOUNTER — Ambulatory Visit (INDEPENDENT_AMBULATORY_CARE_PROVIDER_SITE_OTHER): Payer: BLUE CROSS/BLUE SHIELD | Admitting: Physician Assistant

## 2015-06-25 ENCOUNTER — Encounter: Payer: Self-pay | Admitting: Physician Assistant

## 2015-06-25 VITALS — BP 118/78 | HR 68 | Ht 72.0 in | Wt 203.0 lb

## 2015-06-25 DIAGNOSIS — Z9861 Coronary angioplasty status: Secondary | ICD-10-CM

## 2015-06-25 DIAGNOSIS — I2510 Atherosclerotic heart disease of native coronary artery without angina pectoris: Secondary | ICD-10-CM | POA: Diagnosis not present

## 2015-06-25 DIAGNOSIS — I10 Essential (primary) hypertension: Secondary | ICD-10-CM | POA: Diagnosis not present

## 2015-06-25 DIAGNOSIS — E785 Hyperlipidemia, unspecified: Secondary | ICD-10-CM

## 2015-06-25 DIAGNOSIS — R079 Chest pain, unspecified: Secondary | ICD-10-CM

## 2015-06-25 NOTE — Patient Instructions (Signed)
.     Medication Instructions:   Your physician recommends that you continue on your current medications as directed. Please refer to the Current Medication list given to you today.    Labwork:   FASTING LABS IN A WEEK    Testing/Procedures:   Follow-Up:  3 MONTHS WITH DR Eldridge Dace  Any Other Special Instructions Will Be Listed Below (If Applicable).

## 2015-06-25 NOTE — Progress Notes (Signed)
Patient ID: Marvin Soto, male   DOB: 24-Jun-1966, 49 y.o.   MRN: [id number]    Date:  06/25/2015   ID:  Marvin Soto, DOB [birth year], MRN [id number]  PCP:  Sissy Hoff, MD  Primary Cardiologist:  Eldridge Dace  Chief Complaint  Patient presents with  . Coronary Artery Disease     History of Present Illness: Marvin Soto is a 49 y.o. male with h/o HTN and family h/o CAD (father with MI at age 44), admitted for elective LHC on 06/10/2015 after undergoing an abnormal NST conducted for chest pain eval.  He was found to have a 95% stenosed mid LAD, successfully treated with PCI + DES. LVF was normal with EF of 55-65%.  He was placed on DAPT with ASA + Plavix. He was also continued on a statin and ARB. Low dose BB therapy with Lopressor was added. His Nexium was changed to Protonix given concomitant use of Plavix. Imdur was discontinued as it was felt his angina will resolve post PCI.  Patient presents for posthospital evaluation.  He reports doing quite well. Within a week after being discharged he went down the beach with his family and had no problems kayaking or fishing or walking on the beach. He did have some minor swelling in the right wrist cath site which resolved.  The patient currently denies nausea, vomiting, fever, chest pain, shortness of breath, orthopnea, dizziness, PND, cough, congestion, abdominal pain, hematochezia, melena, lower extremity edema, claudication.  Wt Readings from Last 3 Encounters:  06/25/15 203 lb (92.08 kg)  06/11/15 205 lb 11 oz (93.3 kg)  05/28/15 204 lb 12.8 oz (92.897 kg)     Past Medical History  Diagnosis Date  . GERD (gastroesophageal reflux disease)   . Dyslipidemia   . Coronary artery disease   . Hypertension   . Anginal pain     Current Outpatient Prescriptions  Medication Sig Dispense Refill  . aspirin 81 MG tablet Take 81 mg by mouth daily.    Marland Kitchen atorvastatin (LIPITOR) 10 MG tablet Take 1 tablet (10 mg total) by mouth daily at 6 PM. 30 tablet  5  . cetirizine (ZYRTEC) 10 MG tablet Take 10 mg by mouth daily.    . clopidogrel (PLAVIX) 75 MG tablet Take 1 tablet (75 mg total) by mouth daily with breakfast. 30 tablet 11  . losartan (COZAAR) 100 MG tablet Take 100 mg by mouth daily.  5  . metoprolol tartrate (LOPRESSOR) 25 MG tablet Take 0.5 tablets (12.5 mg total) by mouth 2 (two) times daily. 30 tablet 5  . pantoprazole (PROTONIX) 40 MG tablet Take 1 tablet (40 mg total) by mouth daily at 6 (six) AM. 30 tablet 11  . VYVANSE 40 MG capsule Take 40 mg by mouth daily.   0   No current facility-administered medications for this visit.    Allergies:   No Known Allergies  Social History:  The patient  reports that he has never smoked. He has never used smokeless tobacco. He reports that he drinks about 1.8 oz of alcohol per week. He reports that he does not use illicit drugs.   Family history:   Family History  Problem Relation Age of Onset  . Heart attack Father   . Multiple sclerosis Sister   . Healthy Mother   . Healthy Sister   . Healthy Sister   . Stroke Father     ROS:  Please see the history of present illness.  All other systems reviewed and negative.  PHYSICAL EXAM: VS:  BP 118/78 mmHg  Pulse 68  Ht 6' (1.829 m)  Wt 203 lb (92.08 kg)  BMI 27.53 kg/m2 Well nourished, well developed, in no acute distress HEENT: Pupils are equal round react to light accommodation extraocular movements are intact.  Neck: no JVDNo cervical lymphadenopathy. Cardiac: Regular rate and rhythm without murmurs rubs or gallops. Lungs:  clear to auscultation bilaterally, no wheezing, rhonchi or rales Ext: no lower extremity edema.  2+ radial and dorsalis pedis pulses. Skin: warm and dry Neuro:  Grossly normal  Lipid Panel  No results found for: CHOL, TRIG, HDL, CHOLHDL, VLDL, LDLCALC, LDLDIRECT   EKG:  Sinus rhythm rate 60 bpm. Septal Q waves  ASSESSMENT AND PLAN:  Problem List Items Addressed This Visit    Essential hypertension     Blood pressure well-controlled. He is on full 0.5 mg of Lopressor twice a day.      Chest pain on exertion - Primary   Relevant Orders   EKG 12-Lead   CAD S/P percutaneous coronary angioplasty- DES to mid LAD 06/10/15    After his heart catheterization and stent placement patient had gone down to the beach and was kayaking and fishing that any complaints of angina. Is on aspirin, Plavix, Lopressor. We will check his lipid panel and determine if his Lipitor needs to be increased. Follow-up in 3 months with Dr.Varanasi.  Discussed healthy eating.       Other Visit Diagnoses    Hyperlipidemia        Relevant Orders    Lipid Profile

## 2015-06-25 NOTE — Assessment & Plan Note (Addendum)
After his heart catheterization and stent placement patient had gone down to the beach and was kayaking and fishing that any complaints of angina. Is on aspirin, Plavix, Lopressor. We will check his lipid panel and determine if his Lipitor needs to be increased. Follow-up in 3 months with Dr.Varanasi.  Discussed healthy eating.

## 2015-06-25 NOTE — Assessment & Plan Note (Signed)
Blood pressure well-controlled. He is on full 0.5 mg of Lopressor twice a day.

## 2015-07-02 ENCOUNTER — Other Ambulatory Visit (INDEPENDENT_AMBULATORY_CARE_PROVIDER_SITE_OTHER): Payer: BLUE CROSS/BLUE SHIELD

## 2015-07-02 DIAGNOSIS — E785 Hyperlipidemia, unspecified: Secondary | ICD-10-CM | POA: Diagnosis not present

## 2015-07-02 LAB — LIPID PANEL
Cholesterol: 134 mg/dL (ref 0–200)
HDL: 33 mg/dL — ABNORMAL LOW (ref >39.00)
LDL Cholesterol: 86 mg/dL (ref 0–99)
NonHDL: 100.93
Total CHOL/HDL Ratio: 4
Triglycerides: 74 mg/dL (ref 0.0–149.0)
VLDL: 14.8 mg/dL (ref 0.0–40.0)

## 2015-08-07 ENCOUNTER — Ambulatory Visit: Payer: Self-pay | Admitting: Cardiology

## 2015-08-14 ENCOUNTER — Ambulatory Visit (HOSPITAL_COMMUNITY): Payer: BLUE CROSS/BLUE SHIELD

## 2015-08-14 ENCOUNTER — Encounter (HOSPITAL_COMMUNITY)
Admission: RE | Admit: 2015-08-14 | Discharge: 2015-08-14 | Disposition: A | Discharge status: Home / Self Care | Payer: BLUE CROSS/BLUE SHIELD | Source: Ambulatory Visit | Attending: Interventional Cardiology | Admitting: Interventional Cardiology

## 2015-08-20 ENCOUNTER — Encounter (HOSPITAL_COMMUNITY): Payer: BLUE CROSS/BLUE SHIELD

## 2015-08-22 ENCOUNTER — Encounter (HOSPITAL_COMMUNITY): Payer: BLUE CROSS/BLUE SHIELD

## 2015-08-25 ENCOUNTER — Encounter (HOSPITAL_COMMUNITY): Payer: BLUE CROSS/BLUE SHIELD

## 2015-08-26 ENCOUNTER — Telehealth (HOSPITAL_COMMUNITY): Payer: Self-pay | Admitting: *Deleted

## 2015-08-27 ENCOUNTER — Encounter (HOSPITAL_COMMUNITY): Payer: BLUE CROSS/BLUE SHIELD

## 2015-08-29 ENCOUNTER — Encounter (HOSPITAL_COMMUNITY): Payer: BLUE CROSS/BLUE SHIELD

## 2015-09-01 ENCOUNTER — Encounter (HOSPITAL_COMMUNITY): Payer: BLUE CROSS/BLUE SHIELD

## 2015-09-02 ENCOUNTER — Telehealth (HOSPITAL_COMMUNITY): Payer: Self-pay | Admitting: *Deleted

## 2015-09-03 ENCOUNTER — Encounter (HOSPITAL_COMMUNITY): Payer: BLUE CROSS/BLUE SHIELD

## 2015-09-05 ENCOUNTER — Encounter (HOSPITAL_COMMUNITY): Payer: BLUE CROSS/BLUE SHIELD

## 2015-09-08 ENCOUNTER — Encounter (HOSPITAL_COMMUNITY): Payer: BLUE CROSS/BLUE SHIELD

## 2015-09-10 ENCOUNTER — Encounter (HOSPITAL_COMMUNITY): Payer: BLUE CROSS/BLUE SHIELD

## 2015-09-12 ENCOUNTER — Encounter (HOSPITAL_COMMUNITY): Payer: BLUE CROSS/BLUE SHIELD

## 2015-09-15 ENCOUNTER — Encounter (HOSPITAL_COMMUNITY): Payer: BLUE CROSS/BLUE SHIELD

## 2015-09-17 ENCOUNTER — Encounter (HOSPITAL_COMMUNITY): Payer: BLUE CROSS/BLUE SHIELD

## 2015-09-19 ENCOUNTER — Encounter (HOSPITAL_COMMUNITY): Payer: BLUE CROSS/BLUE SHIELD

## 2015-09-22 ENCOUNTER — Encounter (HOSPITAL_COMMUNITY): Payer: BLUE CROSS/BLUE SHIELD

## 2015-09-24 ENCOUNTER — Encounter (HOSPITAL_COMMUNITY): Payer: BLUE CROSS/BLUE SHIELD

## 2015-09-26 ENCOUNTER — Encounter (HOSPITAL_COMMUNITY): Payer: BLUE CROSS/BLUE SHIELD

## 2015-09-29 ENCOUNTER — Encounter (HOSPITAL_COMMUNITY): Payer: BLUE CROSS/BLUE SHIELD

## 2015-10-01 ENCOUNTER — Encounter (HOSPITAL_COMMUNITY): Payer: BLUE CROSS/BLUE SHIELD

## 2015-10-02 ENCOUNTER — Ambulatory Visit: Payer: BLUE CROSS/BLUE SHIELD | Admitting: Interventional Cardiology

## 2015-10-03 ENCOUNTER — Encounter (HOSPITAL_COMMUNITY): Payer: BLUE CROSS/BLUE SHIELD

## 2015-10-06 ENCOUNTER — Encounter (HOSPITAL_COMMUNITY): Payer: BLUE CROSS/BLUE SHIELD

## 2015-10-08 ENCOUNTER — Encounter (HOSPITAL_COMMUNITY): Payer: BLUE CROSS/BLUE SHIELD

## 2015-10-10 ENCOUNTER — Encounter (HOSPITAL_COMMUNITY): Payer: BLUE CROSS/BLUE SHIELD

## 2015-10-13 ENCOUNTER — Encounter (HOSPITAL_COMMUNITY): Payer: BLUE CROSS/BLUE SHIELD

## 2015-10-15 ENCOUNTER — Encounter (HOSPITAL_COMMUNITY): Payer: BLUE CROSS/BLUE SHIELD

## 2015-10-17 ENCOUNTER — Encounter (HOSPITAL_COMMUNITY): Payer: BLUE CROSS/BLUE SHIELD

## 2015-10-20 ENCOUNTER — Encounter (HOSPITAL_COMMUNITY): Payer: BLUE CROSS/BLUE SHIELD

## 2015-10-22 ENCOUNTER — Encounter (HOSPITAL_COMMUNITY): Payer: BLUE CROSS/BLUE SHIELD

## 2015-10-27 ENCOUNTER — Encounter (HOSPITAL_COMMUNITY): Payer: BLUE CROSS/BLUE SHIELD

## 2015-10-29 ENCOUNTER — Encounter (HOSPITAL_COMMUNITY): Payer: BLUE CROSS/BLUE SHIELD

## 2015-10-31 ENCOUNTER — Encounter (HOSPITAL_COMMUNITY): Payer: BLUE CROSS/BLUE SHIELD

## 2015-11-03 ENCOUNTER — Encounter (HOSPITAL_COMMUNITY): Payer: BLUE CROSS/BLUE SHIELD

## 2015-11-05 ENCOUNTER — Encounter (HOSPITAL_COMMUNITY): Payer: BLUE CROSS/BLUE SHIELD

## 2015-11-07 ENCOUNTER — Encounter (HOSPITAL_COMMUNITY): Payer: BLUE CROSS/BLUE SHIELD

## 2015-11-10 ENCOUNTER — Encounter (HOSPITAL_COMMUNITY): Payer: BLUE CROSS/BLUE SHIELD

## 2015-11-12 ENCOUNTER — Encounter (HOSPITAL_COMMUNITY): Payer: BLUE CROSS/BLUE SHIELD

## 2015-11-14 ENCOUNTER — Encounter (HOSPITAL_COMMUNITY): Payer: BLUE CROSS/BLUE SHIELD

## 2015-11-17 ENCOUNTER — Encounter (HOSPITAL_COMMUNITY): Payer: BLUE CROSS/BLUE SHIELD

## 2015-11-19 ENCOUNTER — Encounter (HOSPITAL_COMMUNITY): Payer: BLUE CROSS/BLUE SHIELD

## 2015-11-21 ENCOUNTER — Encounter (HOSPITAL_COMMUNITY): Payer: BLUE CROSS/BLUE SHIELD

## 2015-12-03 ENCOUNTER — Other Ambulatory Visit: Payer: Self-pay | Admitting: Cardiology

## 2015-12-03 NOTE — Telephone Encounter (Signed)
Rx request sent to pharmacy.  

## 2016-01-12 NOTE — Progress Notes (Signed)
Patient ID: Marvin Soto, male   DOB: 1966/11/23, 50 y.o.   MRN: [id number]     Cardiology Office Note   Date:  01/13/2016   ID:  Marvin Soto, DOB February 09, 1966, MRN [id number]  PCP:  Sissy Hoff, MD    No chief complaint on file. CAD   Wt Readings from Last 3 Encounters:  01/13/16 207 lb 6.4 oz (94.076 kg)  06/25/15 203 lb (92.08 kg)  06/11/15 205 lb 11 oz (93.3 kg)       History of Present Illness: Marvin Soto is a 50 y.o. male  with h/o HTN and family h/o CAD (father with MI at age 66), admitted for elective LHC on 06/10/2015 after undergoing an abnormal NST conducted for chest pain eval. He was found to have a 95% stenosed mid LAD, successfully treated with PCI + DES. LVF was normal with EF of 55-65%. He was placed on DAPT with ASA + Plavix. He was also continued on a statin and ARB. Low dose BB therapy with Lopressor was added. His Nexium was changed to Protonix given concomitant use of Plavix. Imdur was discontinued as it was felt his angina will resolve post PCI.  The patient currently denies nausea, vomiting, fever, chest pain, shortness of breath, orthopnea, dizziness, PND, cough, congestion, abdominal pain, hematochezia, melena, lower extremity edema, claudication.  He had lost weight, down to 190 lbs, but had regained weight after some inactivity.  He is not exercising.  He has exercise equipment in the house.  He feels that he just has to do it.   His job is somewhat physical and get some exertion that way.   He had been taking his atorvastatin in the evening and was forgetting that particular medicine. He moved it to the morning and he is taking it more regularly.  Past Medical History  Diagnosis Date  . GERD (gastroesophageal reflux disease)   . Dyslipidemia   . Coronary artery disease   . Hypertension   . Anginal pain [facility])     Past Surgical History  Procedure Laterality Date  . Anterior cruciate ligament repair Left 1996  . Mastectomy    . Coronary  angioplasty with stent placement  06/10/2015  . Cardiac catheterization N/A 06/10/2015    Procedure: Left Heart Cath and Coronary Angiography;  Surgeon: [name], MD;  Location: [facility] INVASIVE CV LAB;  Service: Cardiovascular;  Laterality: N/A;  . Cardiac catheterization N/A 06/10/2015    Procedure: Coronary Stent Intervention;  Surgeon: [name], MD;  Location: [facility] INVASIVE CV LAB;  Service: Cardiovascular;  Laterality: N/A;     Current Outpatient Prescriptions  Medication Sig Dispense Refill  . aspirin 81 MG tablet Take 81 mg by mouth daily.    Marland Kitchen atorvastatin (LIPITOR) 10 MG tablet TAKE 1 TABLET(10 MG) BY MOUTH DAILY 6 PM 30 tablet 1  . cetirizine (ZYRTEC) 10 MG tablet Take 10 mg by mouth daily.    . clopidogrel (PLAVIX) 75 MG tablet Take 1 tablet (75 mg total) by mouth daily with breakfast. 30 tablet 11  . losartan (COZAAR) 100 MG tablet Take 100 mg by mouth daily.  5  . metoprolol tartrate (LOPRESSOR) 25 MG tablet TAKE 1/2 TABLET(12.5 MG) BY MOUTH TWICE DAILY 30 tablet 1  . pantoprazole (PROTONIX) 40 MG tablet Take 1 tablet (40 mg total) by mouth daily at 6 (six) AM. 30 tablet 11  . VYVANSE 40 MG capsule Take 40 mg by mouth daily. Pt takes Monday thru friday  0  No current facility-administered medications for this visit.    Allergies:   Review of patient's allergies indicates no known allergies.    Social History:  The patient  reports that he has never smoked. He has never used smokeless tobacco. He reports that he drinks about 1.8 oz of alcohol per week. He reports that he does not use illicit drugs.   Family History:  The patient's family history includes Healthy in his mother, sister, and sister; Heart attack in his father; Multiple sclerosis in his sister; Stroke in his father.    ROS:  Please see the history of present illness.   Otherwise, review of systems are positive for weight gain recently, easy bruising.   All other systems are reviewed and negative.     PHYSICAL EXAM: VS:  BP 130/80 mmHg  Pulse 68  Ht 6' (1.829 m)  Wt 207 lb 6.4 oz (94.076 kg)  BMI 28.12 kg/m2  SpO2 99% , BMI Body mass index is 28.12 kg/(m^2). GEN: Well nourished, well developed, in no acute distress HEENT: normal Neck: no JVD, carotid bruits, or masses Cardiac: RRR; no murmurs, rubs, or gallops,no edema ,  2+ right radial pulse Respiratory:  clear to auscultation bilaterally, normal work of breathing GI: soft, nontender, nondistended, + BS MS: no deformity or atrophy Skin: warm and dry, no rash Neuro:  Strength and sensation are intact Psych: euthymic mood, full affect     Recent Labs: 06/11/2015: BUN 8; Creatinine, Ser 1.08; Hemoglobin 12.9*; Platelets 194; Potassium 4.5; Sodium 141   Lipid Panel    Component Value Date/Time   CHOL 134 07/02/2015 0844   TRIG 74.0 07/02/2015 0844   HDL 33.00* 07/02/2015 0844   CHOLHDL 4 07/02/2015 0844   VLDL 14.8 07/02/2015 0844   LDLCALC 86 07/02/2015 0844     Other studies Reviewed: Additional studies/ records that were reviewed today with results demonstrating:  Cardiac cath results reviewed. Drug-eluting stent to the LAD in July 2016..   ASSESSMENT AND PLAN:  1. CAD: No angina. Continue DAPT for at least a year.   Status post LAD stent. No symptoms like he had prior to the stent. 2. HTN: Controlled.  COntinue current meds.   Well controlled. We can certainly refill his losartan if he needs refills at any point. 3. Hyperlipidemia: He is back to daily  Atorvastatin 10 mg. He had stopped taking this regularly for a while and repeat lipid check with his primary care doctor showed an elevated LDL. For about a month, he has been taking the atorvastatin regularly. We'll recheck his lipids and liver test at the end of March. 4.  we spoke about the importance of trying to maintain healthy habits such as regular exercise and avoiding fast food.   Current medicines are reviewed at length with the patient today.  The  patient concerns regarding his medicines were addressed.  The following changes have been made:  No change  Labs/ tests ordered today include:  No orders of the defined types were placed in this encounter.    Recommend 150 minutes/week of aerobic exercise Low fat, low carb, high fiber diet recommended  Disposition:   FU in 6 months   Delorise Jackson., MD  01/13/2016 8:59 AM    [facility] [address] [place], [place], [place]  [address] Phone: [phone]; Fax: [phone]

## 2016-01-13 ENCOUNTER — Encounter: Payer: Self-pay | Admitting: Interventional Cardiology

## 2016-01-13 ENCOUNTER — Ambulatory Visit (INDEPENDENT_AMBULATORY_CARE_PROVIDER_SITE_OTHER): Payer: BLUE CROSS/BLUE SHIELD | Admitting: Interventional Cardiology

## 2016-01-13 VITALS — BP 130/80 | HR 68 | Ht 72.0 in | Wt 207.4 lb

## 2016-01-13 DIAGNOSIS — I2510 Atherosclerotic heart disease of native coronary artery without angina pectoris: Secondary | ICD-10-CM

## 2016-01-13 DIAGNOSIS — E785 Hyperlipidemia, unspecified: Secondary | ICD-10-CM | POA: Diagnosis not present

## 2016-01-13 DIAGNOSIS — I10 Essential (primary) hypertension: Secondary | ICD-10-CM

## 2016-01-13 DIAGNOSIS — Z9861 Coronary angioplasty status: Secondary | ICD-10-CM

## 2016-01-13 NOTE — Patient Instructions (Signed)
**Note De-Identified Marvin Soto Obfuscation** Medication Instructions:  Same-no chnages  Labwork: End of March-Lipids and LFTS-Please do not eat or drink after midnight the night before labs are drawn.  Testing/Procedures: None  Follow-Up: Your physician wants you to follow-up in: 6 months. You will receive a reminder letter in the mail two months in advance. If you don't receive a letter, please call our office to schedule the follow-up appointment.     If you need a refill on your cardiac medications before your next appointment, please call your pharmacy.

## 2016-01-30 ENCOUNTER — Other Ambulatory Visit: Payer: Self-pay | Admitting: Interventional Cardiology

## 2016-02-23 ENCOUNTER — Other Ambulatory Visit (INDEPENDENT_AMBULATORY_CARE_PROVIDER_SITE_OTHER): Payer: BLUE CROSS/BLUE SHIELD | Admitting: *Deleted

## 2016-02-23 DIAGNOSIS — E785 Hyperlipidemia, unspecified: Secondary | ICD-10-CM | POA: Diagnosis not present

## 2016-02-23 LAB — LIPID PANEL
Cholesterol: 119 mg/dL — ABNORMAL LOW (ref 125–200)
HDL: 35 mg/dL — AB (ref >=40)
LDL Cholesterol: 71 mg/dL (ref <130)
TRIGLYCERIDES: 67 mg/dL (ref <150)
Total CHOL/HDL Ratio: 3.4 Ratio (ref <=5.0)
VLDL: 13 mg/dL (ref <30)

## 2016-02-23 LAB — HEPATIC FUNCTION PANEL
ALBUMIN: 4.5 g/dL (ref 3.6–5.1)
ALT: 20 U/L (ref 9–46)
AST: 16 U/L (ref 10–40)
Alkaline Phosphatase: 85 U/L (ref 40–115)
Bilirubin, Direct: 0.2 mg/dL (ref <=0.2)
Indirect Bilirubin: 0.5 mg/dL (ref 0.2–1.2)
Total Bilirubin: 0.7 mg/dL (ref 0.2–1.2)
Total Protein: 6.9 g/dL (ref 6.1–8.1)

## 2016-04-06 ENCOUNTER — Other Ambulatory Visit: Payer: Self-pay | Admitting: Interventional Cardiology

## 2016-06-07 ENCOUNTER — Other Ambulatory Visit: Payer: Self-pay | Admitting: Cardiology

## 2016-07-14 ENCOUNTER — Encounter: Payer: Self-pay | Admitting: Interventional Cardiology

## 2016-07-14 ENCOUNTER — Ambulatory Visit (INDEPENDENT_AMBULATORY_CARE_PROVIDER_SITE_OTHER): Payer: BLUE CROSS/BLUE SHIELD | Admitting: Interventional Cardiology

## 2016-07-14 VITALS — BP 132/86 | HR 67 | Ht 72.0 in | Wt 209.6 lb

## 2016-07-14 DIAGNOSIS — I2510 Atherosclerotic heart disease of native coronary artery without angina pectoris: Secondary | ICD-10-CM

## 2016-07-14 DIAGNOSIS — I10 Essential (primary) hypertension: Secondary | ICD-10-CM

## 2016-07-14 DIAGNOSIS — E785 Hyperlipidemia, unspecified: Secondary | ICD-10-CM | POA: Diagnosis not present

## 2016-07-14 DIAGNOSIS — R5382 Chronic fatigue, unspecified: Secondary | ICD-10-CM

## 2016-07-14 DIAGNOSIS — Z9861 Coronary angioplasty status: Secondary | ICD-10-CM

## 2016-07-14 MED ORDER — NITROGLYCERIN 0.4 MG SL SUBL: 0.4000 mg | SUBLINGUAL_TABLET | SUBLINGUAL | 3 refills | Status: DC | PRN

## 2016-07-14 NOTE — Progress Notes (Signed)
Cardiology Office Note   Date:  07/14/2016   ID:  Marvin ShipKeith Dufner, DOB 02/23/1966, MRN [id number]  PCP:  Sissy HoffSWAYNE,DAVID W, MD    No chief complaint on file. CAD   Wt Readings from Last 3 Encounters:  07/14/16 209 lb 9.6 oz (95.1 kg)  01/13/16 207 lb 6.4 oz (94.1 kg)  06/25/15 203 lb (92.1 kg)       History of Present Illness: Marvin Soto is a 50 y.o. male   with h/o HTN and family h/o CAD (father with MI at age 50), admitted for elective LHC on 06/10/2015 after undergoing an abnormal NST conducted for chest pain eval. He was found to have a 95% stenosed mid LAD, successfully treated with PCI + DES. LVF was normal with EF of 55-65%. He was placed on DAPT with ASA + Plavix. He was also continued on a statin and ARB. Low dose BB therapy with Lopressor was added. His Nexium was changed to Protonix given concomitant use of Plavix. Imdur was discontinued as it was felt his angina will resolve post PCI.  The patient currently denies nausea, vomiting, fever, chest pain, shortness of breath, orthopnea, dizziness, PND, cough, congestion, abdominal pain, hematochezia, melena, lower extremity edema, claudication.  He kayaked for 5-6 hours a week ago without chest pain.  He had lost weight, down to 190 lbs, but had regained weight after some inactivity over the past year.  He is not exercising regularly.  He has exercise equipment in the house.  He knows that he just has to do it.   His job is somewhat physical and get some exertion that way.  He will be getting more busy in the next few months.  He had a stretch where mentally, he did not want to take care of himself- but this has improved.   He had been taking his atorvastatin in the evening and was forgetting that particular medicine. He moved it to the morning and he is taking it more regularly.  No NTG usage.  No sx like his prior angina.      Past Medical History:  Diagnosis Date  . Anginal pain (HCC)   . Coronary artery disease   .  Dyslipidemia   . GERD (gastroesophageal reflux disease)   . Hypertension     Past Surgical History:  Procedure Laterality Date  . ANTERIOR CRUCIATE LIGAMENT REPAIR Left 1996  . CARDIAC CATHETERIZATION N/A 06/10/2015   Procedure: Left Heart Cath and Coronary Angiography;  Surgeon: Corky CraftsJayadeep S Samora Jernberg, MD;  [facility];  Service: Cardiovascular;  Laterality: N/A;  . CARDIAC CATHETERIZATION N/A 06/10/2015   Procedure: Coronary Stent Intervention;  Surgeon: Corky CraftsJayadeep S Sydnie Sigmund, MD;  [facility];  Service: Cardiovascular;  Laterality: N/A;  . CORONARY ANGIOPLASTY WITH STENT PLACEMENT  06/10/2015  . MASTECTOMY       Current Outpatient Prescriptions  Medication Sig Dispense Refill  . aspirin 81 MG tablet Take 81 mg by mouth daily.    Marland Kitchen. atorvastatin (LIPITOR) 10 MG tablet TAKE 1 TABLET BY MOUTH EVERY DAY AT 6PM 30 tablet 10  . cetirizine (ZYRTEC) 10 MG tablet Take 10 mg by mouth daily.    . clopidogrel (PLAVIX) 75 MG tablet TAKE 1 TABLET(75 MG) BY MOUTH DAILY WITH BREAKFAST 90 tablet 1  . losartan (COZAAR) 100 MG tablet Take 100 mg by mouth daily.  5  . metoprolol tartrate (LOPRESSOR) 25 MG tablet TAKE 1/2 TABLET BY MOUTH TWICE DAILY 30 tablet 8  .  pantoprazole (PROTONIX) 40 MG tablet TAKE 1 TABLET(40 MG) BY MOUTH DAILY 6 AM 90 tablet 1  . VYVANSE 40 MG capsule Take 40 mg by mouth daily. Pt takes Monday thru friday  0   No current facility-administered medications for this visit.     Allergies:   Review of patient's allergies indicates no known allergies.    Social History:  The patient  reports that he has never smoked. He has never used smokeless tobacco. He reports that he drinks about 1.8 oz of alcohol per week . He reports that he does not use drugs.   Family History:  The patient's family history includes Healthy in his mother, sister, and sister; Heart attack in his father; Multiple sclerosis in his sister; Stroke in his father.    ROS:  Please see  the history of present illness.   Otherwise, review of systems are positive for .   All other systems are reviewed and negative.    PHYSICAL EXAM: VS:  BP 132/86   Pulse 67   Ht 6' (1.829 m)   Wt 209 lb 9.6 oz (95.1 kg)   BMI 28.43 kg/m  , BMI Body mass index is 28.43 kg/m. GEN: Well nourished, well developed, in no acute distress  HEENT: normal  Neck: no JVD, carotid bruits, or masses Cardiac: RRR; no murmurs, rubs, or gallops,no edema , 2+ DP pulses bilaterally Respiratory:  clear to auscultation bilaterally, normal work of breathing GI: soft, nontender, nondistended, + BS MS: no deformity or atrophy  Skin: warm and dry, no rash Neuro:  Strength and sensation are intact Psych: euthymic mood, full affect   EKG:   The ekg ordered today demonstrates NSR, no ST segment changes   Recent Labs: 02/23/2016: ALT 20   Lipid Panel    Component Value Date/Time   CHOL 119 (L) 02/23/2016 0802   TRIG 67 02/23/2016 0802   HDL 35 (L) 02/23/2016 0802   CHOLHDL 3.4 02/23/2016 0802   VLDL 13 02/23/2016 0802   LDLCALC 71 02/23/2016 0802     Other studies Reviewed: Additional studies/ records that were reviewed today with results demonstrating: cath records reviewed.   ASSESSMENT AND PLAN:  1. CAD: More than a year post DES to the LAD.  Stop aspirin.  Continue clopidogrel.  Will give Rx for SL NTG.  Continue aggressive secondary prevention.   2. Fatigue:  Stop metoprolol.  Increase exercise.  Would not recommend supplemental testosterone given his cardiac history. 3. Hyperlipidemia:  Continue atorvastatin.  Lipids well controlled in 3/17.  He will have lipids checked with Dr. Azucena CecilSwayne.   Spoke about eating a healthy diet as well.  4. HTN: BP well controlled.   Current medicines are reviewed at length with the patient today.  The patient concerns regarding his medicines were addressed.  The following changes have been made:  As above  Labs/ tests ordered today include:  No orders of  the defined types were placed in this encounter.   Recommend 150 minutes/week of aerobic exercise Low fat, low carb, high fiber diet recommended  Disposition:   FU in 1 year   Signed, Lance MussJayadeep Kyron Schlitt, MD  07/14/2016 10:25 AM    [facility] [address], [place], [place]  [address] Phone: [phone]; Fax: [phone]) [phone]

## 2016-07-14 NOTE — Patient Instructions (Signed)
Medication Instructions:  Stop taking Aspirin and Metoprolol. Start taking Nitroglycerin as directed at todays office visit for chest pain. Continue to take Plavix. All other medications remain the same.  Labwork: None  Testing/Procedures: None  Follow-Up: Your physician wants you to follow-up in: 1 year. You will receive a reminder letter in the mail two months in advance. If you don't receive a letter, please call our office to schedule the follow-up appointment.     If you need a refill on your cardiac medications before your next appointment, please call your pharmacy.

## 2016-09-03 ENCOUNTER — Encounter: Payer: Self-pay | Admitting: Interventional Cardiology

## 2016-09-22 ENCOUNTER — Encounter: Payer: Self-pay | Admitting: Interventional Cardiology

## 2016-12-05 ENCOUNTER — Other Ambulatory Visit: Payer: Self-pay | Admitting: Cardiology

## 2017-02-10 ENCOUNTER — Other Ambulatory Visit: Payer: Self-pay | Admitting: Interventional Cardiology

## 2017-02-22 ENCOUNTER — Telehealth: Payer: Self-pay | Admitting: Cardiology

## 2017-02-22 ENCOUNTER — Encounter: Payer: Self-pay | Admitting: Interventional Cardiology

## 2017-02-22 NOTE — Telephone Encounter (Signed)
Patient returned my call and stated that his chest tightness was different from when it happen 2 years ago. He states that he was just a little worked up from trying to get the kids to stand still. He stated that he though maybe his BP was elevated, but did not check it at that time. He states that when he had the chest tightness that he just took a couple of deep breaths and it went away. He denies any other symptoms. He just wanted to make Dr. Eldridge DaceVaranasi aware.

## 2017-02-22 NOTE — Telephone Encounter (Signed)
Attempted to call patient after receiving MyChart email from patient and wanted to gather more information from the patient since he was complaining of chest tightness. Patient did not answer. Left a message for patient to call the office back.

## 2017-02-22 NOTE — Telephone Encounter (Signed)
Pt called to discuss episode of chest pain. 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 He developed mid chest dull discomfort, non-radiating and not associated with any shortness of breath, dizziness, palpitations, or nausea. It lasted only about 10 seconds and resolved spontaneously. He had left a message earlier today for Dr. Eldridge DaceVaranasi and hoped to hear from him.  He is concerned due to his cardiac history. He is not having any exertional anginal type pains.  He carries NTG with him and I reviewed when and how he should use this. I advised him that his one episode was not very concerning, but if he has more episodes or a worse episode to call back or go to ER.   He verbalized understanding.   Berton BonJanine Akirra Lacerda, AGNP-C 02/22/2017  7:31 PM

## 2017-02-23 ENCOUNTER — Encounter: Payer: Self-pay | Admitting: Interventional Cardiology

## 2017-02-24 NOTE — Telephone Encounter (Signed)
Called to inform patient of Dr. Hoyle BarrVaranasi's recommendations. Patient states that he has had a couple of other episodes of discomfort in his chest that only last a few seconds and go away on its own. He denies any other symptoms. He states that he has not taken any NTG as he doesn't feel like the discomfort is that bad. He states that he has already scheduled an appointment with Dr. Eldridge DaceVaranasi on 4/17.

## 2017-02-24 NOTE — Telephone Encounter (Signed)
I would be more concerned if he had more prolonged episodes, particularly with exertion.  If he is having more frequent episodes, let us know.  At this point, would continue regular exercise and current meds.

## 2017-02-28 ENCOUNTER — Encounter: Payer: Self-pay | Admitting: Interventional Cardiology

## 2017-03-15 ENCOUNTER — Encounter: Payer: Self-pay | Admitting: Interventional Cardiology

## 2017-03-15 ENCOUNTER — Ambulatory Visit (INDEPENDENT_AMBULATORY_CARE_PROVIDER_SITE_OTHER): Payer: BC Managed Care – PPO | Admitting: Interventional Cardiology

## 2017-03-15 VITALS — BP 134/84 | HR 71 | Ht 72.0 in | Wt 217.0 lb

## 2017-03-15 DIAGNOSIS — E785 Hyperlipidemia, unspecified: Secondary | ICD-10-CM

## 2017-03-15 DIAGNOSIS — Z9861 Coronary angioplasty status: Secondary | ICD-10-CM

## 2017-03-15 DIAGNOSIS — R079 Chest pain, unspecified: Secondary | ICD-10-CM | POA: Diagnosis not present

## 2017-03-15 DIAGNOSIS — I25118 Atherosclerotic heart disease of native coronary artery with other forms of angina pectoris: Secondary | ICD-10-CM | POA: Diagnosis not present

## 2017-03-15 DIAGNOSIS — I2510 Atherosclerotic heart disease of native coronary artery without angina pectoris: Secondary | ICD-10-CM

## 2017-03-15 DIAGNOSIS — I10 Essential (primary) hypertension: Secondary | ICD-10-CM | POA: Diagnosis not present

## 2017-03-15 NOTE — Progress Notes (Signed)
Cardiology Office Note   Date:  03/15/2017   ID:  [name], DOB 01/28/1966, MRN [id number]  PCP:  Sissy Hoff, MD    No chief complaint on file. CAD   Wt Readings from Last 3 Encounters:  03/15/17 217 lb (98.4 kg)  07/14/16 209 lb 9.6 oz (95.1 kg)  01/13/16 207 lb 6.4 oz (94.1 kg)       History of Present Illness: Marvin Soto is a 51 y.o. male   with h/o HTN and family h/o CAD (father with MI at age 49), admitted for elective LHC on 06/10/2015 after undergoing an abnormal NST conducted for chest pain eval. He was found to have a 95% stenosed mid LAD, successfully treated with PCI + DES. LVF was normal with EF of 55-65%. He was placed on DAPT with ASA + Plavix. He was also continued on a statin and ARB. Low dose BB therapy with Lopressor was added. His Nexium was changed to Protonix given concomitant use of Plavix. Imdur was discontinued as it was felt his angina will resolve post PCI.  The patient currently denies nausea, vomiting, fever, chest pain, shortness of breath, orthopnea, dizziness, PND, cough, congestion, abdominal pain, hematochezia, melena, lower extremity edema, claudication.  He kayaked for 5-6 hours a week ago without chest pain.  He had lost weight, down to 190 lbs, but had regained weight after some inactivity over the past year.  He is not exercising regularly.  He has exercise equipment in the house.  He knows that he just has to do it.   His job is somewhat physical and get some exertion that way.  He will be getting more busy in the next few months.  He had a stretch where mentally, he did not want to take care of himself- but this has improved.   He had been taking his atorvastatin in the evening and was forgetting that particular medicine. He moved it to the morning and he is taking it more regularly.  No NTG usage.  No sx like his prior angina.  He had one episode of chest pain while getting stressed, but it was different than his prior angina.   He has carried Haematologist and his kayak without difficulties.  He rides an exercise bike without problems.     Past Medical History:  Diagnosis Date  . Anginal pain (HCC)   . Coronary artery disease   . Dyslipidemia   . GERD (gastroesophageal reflux disease)   . Hypertension     Past Surgical History:  Procedure Laterality Date  . ANTERIOR CRUCIATE LIGAMENT REPAIR Left 1996  . CARDIAC CATHETERIZATION N/A 06/10/2015   Procedure: Left Heart Cath and Coronary Angiography;  Surgeon: [name], MD;  [facility] CV LAB;  Service: Cardiovascular;  Laterality: N/A;  . CARDIAC CATHETERIZATION N/A 06/10/2015   Procedure: Coronary Stent Intervention;  Surgeon: [name], MD;  Location: [facility] INVASIVE CV LAB;  Service: Cardiovascular;  Laterality: N/A;  . CORONARY ANGIOPLASTY WITH STENT PLACEMENT  06/10/2015  . MASTECTOMY       Current Outpatient Prescriptions  Medication Sig Dispense Refill  . atorvastatin (LIPITOR) 10 MG tablet TAKE 1 TABLET BY MOUTH EVERY DAY AT 6PM 90 tablet 1  . cetirizine (ZYRTEC) 10 MG tablet Take 10 mg by mouth daily.    . clopidogrel (PLAVIX) 75 MG tablet TAKE 1 TABLET BY MOUTH EVERY DAY WITH BREAKFAST 90 tablet 2  . losartan (COZAAR) 100 MG tablet Take 100 mg  by mouth daily.  5  . nitroGLYCERIN (NITROSTAT) 0.4 MG SL tablet Place 1 tablet (0.4 mg total) under the tongue every 5 (five) minutes as needed for chest pain. 25 tablet 3  . pantoprazole (PROTONIX) 40 MG tablet TAKE 1 TABLET(40 MG) BY MOUTH DAILY AT 6 AM 90 tablet 2  . VYVANSE 40 MG capsule Take 40 mg by mouth daily. Pt takes Monday thru friday  0   No current facility-administered medications for this visit.     Allergies:   Patient has no known allergies.    Social History:  The patient  reports that he has never smoked. He has never used smokeless tobacco. He reports that he drinks about 1.8 oz of alcohol per week . He reports that he does not use drugs.   Family  History:  The patient's family history includes Healthy in his mother, sister, and sister; Heart attack in his father; Multiple sclerosis in his sister; Stroke in his father.    ROS:  Please see the history of present illness.   Otherwise, review of systems are positive for one episode of chest pain.   All other systems are reviewed and negative.    PHYSICAL EXAM: VS:  BP 134/84   Pulse 71   Ht 6' (1.829 m)   Wt 217 lb (98.4 kg)   BMI 29.43 kg/m  , BMI Body mass index is 29.43 kg/m. GEN: Well nourished, well developed, in no acute distress  HEENT: normal  Neck: no JVD, carotid bruits, or masses Cardiac: RRR; no murmurs, rubs, or gallops,no edema , 2+ DP pulses bilaterally Respiratory:  clear to auscultation bilaterally, normal work of breathing GI: soft, nontender, nondistended, + BS MS: no deformity or atrophy  Skin: warm and dry, no rash Neuro:  Strength and sensation are intact Psych: euthymic mood, full affect   EKG:   The ekg ordered today demonstrates NSR, no ST segment changes   Recent Labs: No results found for requested labs within last 8760 hours.   Lipid Panel    Component Value Date/Time   CHOL 119 (L) 02/23/2016 0802   TRIG 67 02/23/2016 0802   HDL 35 (L) 02/23/2016 0802   CHOLHDL 3.4 02/23/2016 0802   VLDL 13 02/23/2016 0802   LDLCALC 71 02/23/2016 0802     Other studies Reviewed: Additional studies/ records that were reviewed today with results demonstrating: cath records reviewed.   ASSESSMENT AND PLAN:  1. CAD: More than a year post DES to the LAD.  Stopped aspirin.  Continue clopidogrel.  Will give Rx for SL NTG.  Continue aggressive secondary prevention.  He restarted the aspirin and metoprolol after the episode of chest pain.  No further sx.  Will plan for exercise treadmill test.   2. Fatigue:  No difference after restarting metoprolol.  Increase exercise after stress test.  Would not recommend supplemental testosterone given his cardiac  history. 3. Hyperlipidemia:  Continue atorvastatin.  Lipids well controlled in 3/17.  Recheck lipids on day of stress test.   Spoke about eating a healthy diet as well.  4. HTN: BP well controlled. 5. Hold metoprolol for stress test.     Current medicines are reviewed at length with the patient today.  The patient concerns regarding his medicines were addressed.  The following changes have been made:  As above  Labs/ tests ordered today include:  No orders of the defined types were placed in this encounter.   Recommend 150 minutes/week of aerobic exercise Low  fat, low carb, high fiber diet recommended  Disposition:   FU for stress test   Signed, Lance Muss, MD  03/15/2017 4:35 PM    [facility] [address] [place], [place], [place]  [address] Phone: [phone]; Fax: [phone]

## 2017-03-15 NOTE — Patient Instructions (Signed)
Medication Instructions:  Your physician recommends that you continue on your current medications as directed. Please refer to the Current Medication list given to you today.   Labwork: Your physician recommends that you return for lab work in:  1 week for FASTING LIPIDS, CMET (same day as ETT)   Testing/Procedures: Your physician has requested that you have an exercise tolerance test. For further information please visit [email or web address] Please also follow instruction sheet, as given.    Follow-Up: Based on results    Any Other Special Instructions Will Be Listed Below (If Applicable).     If you need a refill on your cardiac medications before your next appointment, please call your pharmacy.

## 2017-03-23 ENCOUNTER — Other Ambulatory Visit: Payer: BC Managed Care – PPO | Admitting: *Deleted

## 2017-03-23 ENCOUNTER — Ambulatory Visit (INDEPENDENT_AMBULATORY_CARE_PROVIDER_SITE_OTHER): Payer: BC Managed Care – PPO

## 2017-03-23 DIAGNOSIS — E785 Hyperlipidemia, unspecified: Secondary | ICD-10-CM

## 2017-03-23 DIAGNOSIS — R079 Chest pain, unspecified: Secondary | ICD-10-CM

## 2017-03-23 LAB — EXERCISE TOLERANCE TEST
CSEPHR: 100 %
CSEPPHR: 169 {beats}/min
Estimated workload: 10.1 METS
Exercise duration (min): 8 min
Exercise duration (sec): 1 s
MPHR: 169 {beats}/min
RPE: 15
Rest HR: 79 {beats}/min

## 2017-03-24 ENCOUNTER — Telehealth: Payer: Self-pay | Admitting: Interventional Cardiology

## 2017-03-24 LAB — LIPID PANEL
CHOLESTEROL TOTAL: 138 mg/dL (ref 100–199)
Chol/HDL Ratio: 4.1 ratio (ref 0.0–5.0)
HDL: 34 mg/dL — ABNORMAL LOW (ref >39)
LDL CALC: 82 mg/dL (ref 0–99)
TRIGLYCERIDES: 108 mg/dL (ref 0–149)
VLDL CHOLESTEROL CAL: 22 mg/dL (ref 5–40)

## 2017-03-24 LAB — COMPREHENSIVE METABOLIC PANEL
ALT: 17 IU/L (ref 0–44)
AST: 17 IU/L (ref 0–40)
Albumin/Globulin Ratio: 1.7 (ref 1.2–2.2)
Albumin: 4.6 g/dL (ref 3.5–5.5)
Alkaline Phosphatase: 81 IU/L (ref 39–117)
BUN/Creatinine Ratio: 15 (ref 9–20)
BUN: 16 mg/dL (ref 6–24)
Bilirubin Total: 0.8 mg/dL (ref 0.0–1.2)
CO2: 23 mmol/L (ref 18–29)
Calcium: 9.3 mg/dL (ref 8.7–10.2)
Chloride: 102 mmol/L (ref 96–106)
Creatinine, Ser: 1.05 mg/dL (ref 0.76–1.27)
GFR calc Af Amer: 95 mL/min/{1.73_m2} (ref >59)
GFR calc non Af Amer: 82 mL/min/{1.73_m2} (ref >59)
GLUCOSE: 84 mg/dL (ref 65–99)
Globulin, Total: 2.7 g/dL (ref 1.5–4.5)
Potassium: 4.1 mmol/L (ref 3.5–5.2)
SODIUM: 143 mmol/L (ref 134–144)
Total Protein: 7.3 g/dL (ref 6.0–8.5)

## 2017-03-24 NOTE — Telephone Encounter (Signed)
New Message   Pt calling to follow up on test results from exercise tolerance test. Requesting call back

## 2017-03-24 NOTE — Telephone Encounter (Signed)
Patient notified that final result have not been given by Dr. Eldridge Dace. However, preliminary results reviewed with patient. Patient verbalized understanding.

## 2017-08-15 ENCOUNTER — Other Ambulatory Visit: Payer: Self-pay | Admitting: *Deleted

## 2017-08-15 MED ORDER — ATORVASTATIN CALCIUM 10 MG PO TABS: 10.0000 mg | ORAL_TABLET | Freq: Every day | ORAL | 1 refills | Status: DC

## 2017-08-15 NOTE — Telephone Encounter (Signed)
Rx has been sent to the pharmacy electronically. ° °

## 2017-08-29 ENCOUNTER — Encounter: Payer: Self-pay | Admitting: Interventional Cardiology

## 2017-08-31 ENCOUNTER — Telehealth: Payer: Self-pay | Admitting: Interventional Cardiology

## 2017-08-31 NOTE — Telephone Encounter (Signed)
Called and spoke to patient and made him aware of Dr. Hoyle Barr recommendations to continue to monitor his symptoms at this point since he has not had any symptoms with resuming his regular activities. Advised patient to let us know if his symptoms return. Made patient aware that if his symptoms return, particularly when he is not doing something that involves using his chest muscles strenuously, then the next step would be for him to have a cath. Patient verbalized understanding and thanked me for the call.

## 2017-08-31 NOTE — Telephone Encounter (Signed)
I would just continue to observe at this point.  If he has resumed other physical activities and is not having problems, then we can continue.  If he has more sx with other activities, particularly those activities where he is not using chest muscles strenuously, then would recommend cath.

## 2017-08-31 NOTE — Telephone Encounter (Signed)
Called to touch base with patient regarding MyChart message below. Patient states that Saturday he was working in the yard all day, putting out seed and Biochemist, clinical. Patient states that he tried multiple times (for about 15 minutes) to get his chainsaw to start and was unsuccessful in doing so, he states that he felt tightness in the center of his chest. He states that it felt like the the pain he had when he had his stent placed. He states that he immediately stopped and went inside and the pain went away. He states that he did not take any NTG. He denies having any other symptoms. Patient states that he has not had any other episodes since Saturday and this was the first time that it has happened. Patient wanting to know how Dr. Eldridge Dace wants to proceed. Patient made aware that the information will be forwarded for review and recommendation. Advised for the patient to let us know if his symptoms returned. ER precautions reviewed with the patient and he verbalized understanding. Normal GXT 02/2017.

## 2017-08-31 NOTE — Telephone Encounter (Signed)
See phone note from 10/3

## 2017-08-31 NOTE — Telephone Encounter (Signed)
New Message ° ° pt verbalized that he is returning call for rn  °

## 2017-08-31 NOTE — Telephone Encounter (Signed)
[name]  to [name], MD   Saturday I was doing some lawn work. Putting out fertilizer, seed...etc. it was hot and muggy but not bad. Certainly had my heart rate up. Right after I finished I went to start my chain saw (unsuccessfully) but kept at it for like 15 minutes. Well that familiar pain came back which I haven't felt since you put the stent In A couple years ago. High up in the center of my chest. I stopped and it stopped. How would you like to proceed?

## 2017-08-31 NOTE — Telephone Encounter (Signed)
Attempted to call patient to inquire about his MyChart message. There was no answer. Left message for patient to call back.

## 2017-10-04 NOTE — Telephone Encounter (Signed)
Error

## 2017-10-05 ENCOUNTER — Other Ambulatory Visit: Payer: Self-pay | Admitting: *Deleted

## 2017-10-05 MED ORDER — PANTOPRAZOLE SODIUM 40 MG PO TBEC: 40.0000 mg | DELAYED_RELEASE_TABLET | Freq: Every day | ORAL | 1 refills | Status: DC

## 2017-10-06 ENCOUNTER — Other Ambulatory Visit: Payer: Self-pay | Admitting: Interventional Cardiology

## 2017-10-06 MED ORDER — CLOPIDOGREL BISULFATE 75 MG PO TABS: ORAL_TABLET | ORAL | 1 refills | Status: DC

## 2017-10-06 NOTE — Telephone Encounter (Signed)
Pt's medication has been sent to pt's pharmacy as requested. Confirmation

## 2017-12-21 ENCOUNTER — Encounter: Payer: Self-pay | Admitting: Interventional Cardiology

## 2018-04-02 ENCOUNTER — Other Ambulatory Visit: Payer: Self-pay | Admitting: Interventional Cardiology

## 2018-05-22 ENCOUNTER — Other Ambulatory Visit: Payer: Self-pay | Admitting: Interventional Cardiology

## 2018-05-30 ENCOUNTER — Telehealth: Payer: Self-pay | Admitting: Interventional Cardiology

## 2018-05-30 MED ORDER — CLOPIDOGREL BISULFATE 75 MG PO TABS: ORAL_TABLET | ORAL | 0 refills | Status: DC

## 2018-05-30 MED ORDER — ATORVASTATIN CALCIUM 10 MG PO TABS: 10.0000 mg | ORAL_TABLET | Freq: Every day | ORAL | 0 refills | Status: DC

## 2018-05-30 MED ORDER — PANTOPRAZOLE SODIUM 40 MG PO TBEC: 40.0000 mg | DELAYED_RELEASE_TABLET | Freq: Every day | ORAL | 0 refills | Status: DC

## 2018-05-30 NOTE — Telephone Encounter (Signed)
New message   *STAT* If patient is at the pharmacy, call can be transferred to refill team.   1. Which medications need to be refilled? (please list name of each medication and dose if known) atorvastatin (LIPITOR) 10 MG tablet, clopidogrel (PLAVIX) 75 MG tablet and pantoprazole (PROTONIX) 40 MG tablet  2. Which pharmacy/location (including street and city if local pharmacy) is medication to be sent to?  [facility] [id number] - [place], [place] - [address]. Do they need a 30 day or 90 day supply? 90

## 2018-05-30 NOTE — Telephone Encounter (Signed)
Pt's medications was sent to the pharmacy as requested. Confirmation received.

## 2018-07-01 ENCOUNTER — Other Ambulatory Visit: Payer: Self-pay | Admitting: Interventional Cardiology

## 2018-09-18 ENCOUNTER — Ambulatory Visit: Payer: BC Managed Care – PPO | Admitting: Interventional Cardiology

## 2018-09-18 ENCOUNTER — Encounter: Payer: Self-pay | Admitting: Interventional Cardiology

## 2018-09-18 VITALS — BP 134/84 | HR 69 | Ht 72.0 in | Wt 220.2 lb

## 2018-09-18 DIAGNOSIS — Z9861 Coronary angioplasty status: Secondary | ICD-10-CM

## 2018-09-18 DIAGNOSIS — I2510 Atherosclerotic heart disease of native coronary artery without angina pectoris: Secondary | ICD-10-CM

## 2018-09-18 DIAGNOSIS — I25118 Atherosclerotic heart disease of native coronary artery with other forms of angina pectoris: Secondary | ICD-10-CM

## 2018-09-18 DIAGNOSIS — E785 Hyperlipidemia, unspecified: Secondary | ICD-10-CM

## 2018-09-18 DIAGNOSIS — I10 Essential (primary) hypertension: Secondary | ICD-10-CM | POA: Diagnosis not present

## 2018-09-18 MED ORDER — ESOMEPRAZOLE MAGNESIUM 20 MG PO CPDR: 20.0000 mg | DELAYED_RELEASE_CAPSULE | Freq: Every day | ORAL | Status: DC

## 2018-09-18 NOTE — Patient Instructions (Addendum)
Medication Instructions:  Your physician has recommended you make the following change in your medication:   STOP: protonix  START: nexium 20 gm daily   If you need a refill on your cardiac medications before your next appointment, please call your pharmacy.   Lab work: None Ordered  If you have labs (blood work) drawn today and your tests are completely normal, you will receive your results only by: Marland Kitchen MyChart Message (if you have MyChart) OR . A paper copy in the mail If you have any lab test that is abnormal or we need to change your treatment, we will call you to review the results.  Testing/Procedures: None ordered  Follow-Up: At [facility], you and your health needs are our priority.  As part of our continuing mission to provide you with exceptional heart care, we have created designated Provider Care Teams.  These Care Teams include your primary Cardiologist (physician) and Advanced Practice Providers (APPs -  Physician Assistants and Nurse Practitioners) who all work together to provide you with the care you need, when you need it. . You will need a follow up appointment in 1 year.  Please call our office 2 months in advance to schedule this appointment.  You may see Everette Rank, MD or one of the following Advanced Practice Providers on your designated Care Team:   . Robbie Lis, PA-C . Dayna Dunn, PA-C . Jacolyn Reedy, PA-C  Any Other Special Instructions Will Be Listed Below (If Applicable).

## 2018-09-18 NOTE — Progress Notes (Signed)
Cardiology Office Note   Date:  09/18/2018   ID:  Marvin Soto, DOB [birth year], MRN [id number]  PCP:  [name], MD    No chief complaint on file.  CAD  Wt Readings from Last 3 Encounters:  09/18/18 220 lb 3.2 oz (99.9 kg)  03/15/17 217 lb (98.4 kg)  07/14/16 209 lb 9.6 oz (95.1 kg)       History of Present Illness: Marvin Soto is a 52 y.o. male  with h/o HTN and family h/o CAD (father with MI at age 17), admitted for elective LHC on 06/10/2015 after undergoing an abnormal NST conducted for chest pain eval. He was found to have a 95% stenosed mid LAD, successfully treated with PCI + DES. LVF was normal with EF of 55-65%. He was placed on DAPT with ASA + Plavix. He was also continued on a statin and ARB. Low dose BB therapy with Lopressor was added. His Nexium was changed to Protonix given concomitant use of Plavix. Imdur was discontinued as it was felt his angina will resolve post PCI.  2018 stress test showed:  1. Normal exercise tolerance.  2. No evidence for ischemia by ST segment analysis.   Since the last visit, he has felt well.    Denies : Chest pain. Dizziness. Leg edema. Nitroglycerin use. Orthopnea. Palpitations. Paroxysmal nocturnal dyspnea. Shortness of breath. Syncope.   He has pushed himself with a kayaking tournament.  He was exercising vigorously as well.  Did not lose weight but did not even feel a twinge.    He has had some GERD.  He feels that the nexium worked better.  He bruises a lot less than before.   He was taking Vyvanse occasionally, but noticed his HR was higher.  He has stopped Vyvanse sinece that time.     Past Medical History:  Diagnosis Date  . Anginal pain (HCC)   . Coronary artery disease   . Dyslipidemia   . GERD (gastroesophageal reflux disease)   . Hypertension     Past Surgical History:  Procedure Laterality Date  . ANTERIOR CRUCIATE LIGAMENT REPAIR Left 1996  . CARDIAC CATHETERIZATION N/A 06/10/2015   Procedure: Left  Heart Cath and Coronary Angiography;  Surgeon: [name], MD;  Location: [facility] INVASIVE CV LAB;  Service: Cardiovascular;  Laterality: N/A;  . CARDIAC CATHETERIZATION N/A 06/10/2015   Procedure: Coronary Stent Intervention;  Surgeon: [name], MD;  Location: [facility] INVASIVE CV LAB;  Service: Cardiovascular;  Laterality: N/A;  . CORONARY ANGIOPLASTY WITH STENT PLACEMENT  06/10/2015  . MASTECTOMY       Current Outpatient Medications  Medication Sig Dispense Refill  . atorvastatin (LIPITOR) 10 MG tablet Take 1 tablet (10 mg total) by mouth daily. 90 tablet 0  . cetirizine (ZYRTEC) 10 MG tablet Take 10 mg by mouth daily.    . clopidogrel (PLAVIX) 75 MG tablet Take 1 tablet (75 mg total) by mouth daily. 90 tablet 0  . losartan (COZAAR) 100 MG tablet Take 100 mg by mouth daily.  5  . nitroGLYCERIN (NITROSTAT) 0.4 MG SL tablet Place 1 tablet (0.4 mg total) under the tongue every 5 (five) minutes as needed for chest pain. 25 tablet 3  . pantoprazole (PROTONIX) 40 MG tablet Take 1 tablet (40 mg total) by mouth daily. 90 tablet 0   No current facility-administered medications for this visit.     Allergies:   Patient has no known allergies.    Social History:  The patient  reports that he has never smoked. He has never used smokeless tobacco. He reports that he drinks about 3.0 standard drinks of alcohol per week. He reports that he does not use drugs.   Family History:  The patient's family history includes Healthy in his mother, sister, and sister; Heart attack in his father; Multiple sclerosis in his sister; Stroke in his father.    ROS:  Please see the history of present illness.   Otherwise, review of systems are positive for GERD.   All other systems are reviewed and negative.    PHYSICAL EXAM: VS:  BP 134/84   Pulse 69   Ht 6' (1.829 m)   Wt 220 lb 3.2 oz (99.9 kg)   SpO2 98%   BMI 29.86 kg/m  , BMI Body mass index is 29.86 kg/m. GEN: Well nourished, well developed, in  no acute distress  HEENT: normal  Neck: no JVD, carotid bruits, or masses Cardiac: RRR; no murmurs, rubs, or gallops,no edema  Respiratory:  clear to auscultation bilaterally, normal work of breathing GI: soft, nontender, nondistended, + BS MS: no deformity or atrophy  Skin: warm and dry, no rash Neuro:  Strength and sensation are intact Psych: euthymic mood, full affect   EKG:   The ekg ordered today demonstrates NSR, no ST changes   Recent Labs: No results found for requested labs within last 8760 hours.   Lipid Panel    Component Value Date/Time   CHOL 138 03/23/2017 1344   TRIG 108 03/23/2017 1344   HDL 34 (L) 03/23/2017 1344   CHOLHDL 4.1 03/23/2017 1344   CHOLHDL 3.4 02/23/2016 0802   VLDL 13 02/23/2016 0802   LDLCALC 82 03/23/2017 1344     Other studies Reviewed: Additional studies/ records that were reviewed today with results demonstrating: 2018 stress test reviewed.   ASSESSMENT AND PLAN:  1. CAD: No angina.  Continue aggressive secondary prevention.  2. HTN: The current medical regimen is effective;  continue present plan and medications. 3. Hyperlipidemia:  Lipids well controlled in 5/19.  The current medical regimen is effective;  continue present plan and medications. 4. GERD: OK to switch back to Nexium from Protonix since it has been more than 3 years since the stent.   Current medicines are reviewed at length with the patient today.  The patient concerns regarding his medicines were addressed.  The following changes have been made:  No change  Labs/ tests ordered today include:  No orders of the defined types were placed in this encounter.   Recommend 150 minutes/week of aerobic exercise Low fat, low carb, high fiber diet recommended  Disposition:   FU in 1 year   Signed, Lance Muss, MD  09/18/2018 1:36 PM    [facility] [address] [place], [place], [place]  [address] Phone: [phone]; Fax: [phone]

## 2018-10-14 ENCOUNTER — Other Ambulatory Visit: Payer: Self-pay | Admitting: Interventional Cardiology

## 2018-10-17 ENCOUNTER — Other Ambulatory Visit: Payer: Self-pay

## 2018-10-17 ENCOUNTER — Other Ambulatory Visit: Payer: Self-pay | Admitting: Interventional Cardiology

## 2018-10-17 MED ORDER — ATORVASTATIN CALCIUM 10 MG PO TABS: 10.0000 mg | ORAL_TABLET | Freq: Every day | ORAL | 3 refills | Status: DC

## 2018-10-17 MED ORDER — CLOPIDOGREL BISULFATE 75 MG PO TABS: 75.0000 mg | ORAL_TABLET | Freq: Every day | ORAL | 3 refills | Status: DC

## 2018-10-17 NOTE — Telephone Encounter (Signed)
Pt's medication was sent to pt's pharmacy as requested. Confirmation received.  °

## 2019-06-25 ENCOUNTER — Telehealth: Payer: Self-pay | Admitting: Interventional Cardiology

## 2019-06-25 NOTE — Telephone Encounter (Signed)
Spoke with who reports helping son move into college this weekend and noted a knot behind his left knee that was sore to touch.  He massaged it and it seemed to get smaller.  He has been wearing compression stocking which he reports have been helpful.  He reports the knot has moved down his leg into his calf muscle.  He denies any SOB, discoloration, streaking.  No history of DVT, PVD.  On Plavix for DES.  Advised to call PCP but will ask review with our DOD in Dr Hassell Done absence.  Pt states understanding.

## 2019-06-25 NOTE — Telephone Encounter (Signed)
Reviewed with Dr Tamala Julian who advises to f/u with PCP as previously instructed.

## 2019-06-25 NOTE — Telephone Encounter (Signed)
New Message   Patient has a painful bump that is popping up on his left leg 3 inches below the knee, he massages it out, it goes away, then it comes back in a spot lower on his leg. Patient is now wearing compression socks which are helping with the pain. Patient states that he doesn't know exactly what to do about it or whether he should be calling his PCP about it. Please call patient back and advise.

## 2019-06-29 ENCOUNTER — Other Ambulatory Visit: Payer: Self-pay | Admitting: Family Medicine

## 2019-06-29 DIAGNOSIS — M79605 Pain in left leg: Secondary | ICD-10-CM

## 2019-07-09 ENCOUNTER — Other Ambulatory Visit: Payer: BC Managed Care – PPO

## 2019-10-10 NOTE — Progress Notes (Signed)
Cardiology Office Note    Date:  10/16/2019   ID:  Marvin Soto, DOB [birth year], MRN [id number]  PCP:  Antony Contras, MD  Cardiologist: Larae Grooms, MD EPS: None  Chief Complaint  Patient presents with  . Follow-up    History of Present Illness:  Marvin Soto is a 53 y.o. male with history of CAD status post DES to the mid LAD and normal LVEF on cath and 06/10/2015, normal GXT 2018, hypertension, HLD   LOV with Dr. Irish Lack 08/2018 patient was doing well.  Approved him to switch back to Nexium from Protonix since it was 3 years since his stent.  Patient here for f/u. 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 Traveling around. Exercised a lot during the summer but not now with traveling. No chest pain, shortness of breath, palpitations, dizziness or presyncope. Had a little knot above ankle that resolved with compression stockings.  Past Medical History:  Diagnosis Date  . Anginal pain ([place])   . Coronary artery disease   . Dyslipidemia   . GERD (gastroesophageal reflux disease)   . Hypertension     Past Surgical History:  Procedure Laterality Date  . ANTERIOR CRUCIATE LIGAMENT REPAIR Left 1996  . CARDIAC CATHETERIZATION N/A 06/10/2015   Procedure: Left Heart Cath and Coronary Angiography;  Surgeon: Jettie Booze, MD;  Location: [place] CV LAB;  Service: Cardiovascular;  Laterality: N/A;  . CARDIAC CATHETERIZATION N/A 06/10/2015   Procedure: Coronary Stent Intervention;  Surgeon: Jettie Booze, MD;  Location: [place] CV LAB;  Service: Cardiovascular;  Laterality: N/A;  . CORONARY ANGIOPLASTY WITH STENT PLACEMENT  06/10/2015  . MASTECTOMY      Current Medications: Current Meds  Medication Sig  . atorvastatin (LIPITOR) 10 MG tablet Take 1 tablet (10 mg total) by mouth daily.  . cetirizine (ZYRTEC) 10 MG tablet Take 10 mg by mouth daily.  . clopidogrel (PLAVIX) 75 MG tablet Take 1 tablet (75 mg total) by mouth  daily.  Marland Kitchen esomeprazole (NEXIUM) 20 MG capsule Take 1 capsule (20 mg total) by mouth daily at 12 noon.  Marland Kitchen losartan (COZAAR) 100 MG tablet Take 100 mg by mouth daily.  . nitroGLYCERIN (NITROSTAT) 0.4 MG SL tablet Place 1 tablet (0.4 mg total) under the tongue every 5 (five) minutes as needed for chest pain.     Allergies:   Patient has no known allergies.   Social History   Socioeconomic History  . Marital status: Married    Spouse name: Not on file  . Number of children: Not on file  . Years of education: Not on file  . Highest education level: Not on file  Occupational History  . Not on file  Social Needs  . Financial resource strain: Not on file  . Food insecurity    Worry: Not on file    Inability: Not on file  . Transportation needs    Medical: Not on file    Non-medical: Not on file  Tobacco Use  . Smoking status: Never Smoker  . Smokeless tobacco: Never Used  Substance and Sexual Activity  . Alcohol use: Yes    Alcohol/week: 3.0 standard drinks    Types: 3 Cans of beer per week  . Drug use: No  . Sexual activity: Yes  Lifestyle  . Physical activity    Days per week: Not on file    Minutes per session: Not on file  . Stress: Not on file  Relationships  .  Social Musician on phone: Not on file    Gets together: Not on file    Attends religious service: Not on file    Active member of club or organization: Not on file    Attends meetings of clubs or organizations: Not on file    Relationship status: Not on file  Other Topics Concern  . Not on file  Social History Narrative  . Not on file     Family History:  The patient's family history includes Healthy in his mother, sister, and sister; Heart attack in his father; Multiple sclerosis in his sister; Stroke in his father.   ROS:   Please see the history of present illness.    ROS All other systems reviewed and are negative.   PHYSICAL EXAM:   VS:  BP 130/88   Pulse 69   Ht 6' (1.829 m)   Wt  215 lb (97.5 kg)   SpO2 98%   BMI 29.16 kg/m   Physical Exam  GEN: Well nourished, well developed, in no acute distress  Neck: no JVD, carotid bruits, or masses Cardiac:RRR; no murmurs, rubs, or gallops  Respiratory:  clear to auscultation bilaterally, normal work of breathing GI: soft, nontender, nondistended, + BS Ext: without cyanosis, clubbing, or edema, Good distal pulses bilaterally Neuro:  Alert and Oriented x 3 Psych: euthymic mood, full affect  Wt Readings from Last 3 Encounters:  10/16/19 215 lb (97.5 kg)  09/18/18 220 lb 3.2 oz (99.9 kg)  03/15/17 217 lb (98.4 kg)      Studies/Labs Reviewed:   EKG:  EKG is  ordered today.  The ekg ordered today demonstrates normal sinus rhythm, normal EKG  Recent Labs: No results found for requested labs within last 8760 hours.   Lipid Panel    Component Value Date/Time   CHOL 138 03/23/2017 1344   TRIG 108 03/23/2017 1344   HDL 34 (L) 03/23/2017 1344   CHOLHDL 4.1 03/23/2017 1344   CHOLHDL 3.4 02/23/2016 0802   VLDL 13 02/23/2016 0802   LDLCALC 82 03/23/2017 1344    Additional studies/ records that were reviewed today include:    GXT 2018Blood pressure demonstrated a hypertensive response to exercise.  There was no ST segment deviation noted during stress.   1. Normal exercise tolerance.  2. No evidence for ischemia by ST segment analysis.        Cardiac cath 2016  Mid LAD-2 lesion, 95% stenosed. A 4.0 x 22 resolute drug-eluting stent was deployed. There is a 0% residual stenosis post intervention.  The left ventricular systolic function is normal.   Continue dual antiplatelets therapy for at least a year without interruption. He'll need aggressive secondary prevention. Will change his proton pump inhibitor to protonic due to the Plavix that he is on. Stop Imdur since his angina will likely be resolved with this intervention. He has other mild coronary disease. Try to keep blood pressure under control and LDL  below 100. Dietary modifications as well. He'll be watched overnight.    ASSESSMENT:    1. CAD S/P percutaneous coronary angioplasty- DES to mid LAD 06/10/15   2. Essential hypertension   3. Hyperlipidemia, unspecified hyperlipidemia type   4. Gastroesophageal reflux disease, unspecified whether esophagitis present      PLAN:  In order of problems listed above:  CAD status post DES to the mid LAD with normal LVEF on cath 2016, normal GXT 2018-no angina.  Recommend 150 minutes of exercise weekly.  Will check labs today.  Continue Lipitor and Plavix.  Aspirin was stopped back in 2017 by Dr. Eldridge DaceVaranasi.  Essential hypertension blood pressure pretty well controlled.  Ask him to keep an eye on his diastolic blood pressure and limit his salt.  Continue losartan 100 mg daily.  Hyperlipidemia LDL 98 03/2018.  Goal is less than 70.  Check fasting lipids today.  GERD on Nexium per Dr. Eldridge DaceVaranasi.    Medication Adjustments/Labs and Tests Ordered: Current medicines are reviewed at length with the patient today.  Concerns regarding medicines are outlined above.  Medication changes, Labs and Tests ordered today are listed in the Patient Instructions below. Patient Instructions  Medication Instructions:  Your physician recommends that you continue on your current medications as directed. Please refer to the Current Medication list given to you today.  *If you need a refill on your cardiac medications before your next appointment, please call your pharmacy*  Lab Work: Today - CMET, CBC, Lipids, TSH If you have labs (blood work) drawn today and your tests are completely normal, you will receive your results only by: Marland Kitchen. MyChart Message (if you have MyChart) OR . A paper copy in the mail If you have any lab test that is abnormal or we need to change your treatment, we will call you to review the results.  Testing/Procedures: None ordered.   Follow-Up: At [facility], you and your health needs  are our priority.  As part of our continuing mission to provide you with exceptional heart care, we have created designated Provider Care Teams.  These Care Teams include your primary Cardiologist (physician) and Advanced Practice Providers (APPs -  Physician Assistants and Nurse Practitioners) who all work together to provide you with the care you need, when you need it.  Your next appointment:   12 months  The format for your next appointment:   In Person  Provider:   You may see Lance MussJayadeep Varanasi, MD or one of the following Advanced Practice Providers on your designated Care Team:    Ronie Spiesayna Dunn, PA-C  Jacolyn Reedy , PA-C   Other Instructions Your provider recommends that you maintain 150 minutes per week of moderate aerobic activity.      Elson ClanSigned,  , PA-C  10/16/2019 9:26 AM    [facility] [address], [place], [place]  [address] Phone: [phone]; Fax: [phone]

## 2019-10-16 ENCOUNTER — Encounter: Payer: Self-pay | Admitting: Physician Assistant

## 2019-10-16 ENCOUNTER — Ambulatory Visit: Payer: BC Managed Care – PPO | Admitting: Physician Assistant

## 2019-10-16 ENCOUNTER — Other Ambulatory Visit: Payer: Self-pay

## 2019-10-16 VITALS — BP 130/88 | HR 69 | Ht 72.0 in | Wt 215.0 lb

## 2019-10-16 DIAGNOSIS — I10 Essential (primary) hypertension: Secondary | ICD-10-CM

## 2019-10-16 DIAGNOSIS — E785 Hyperlipidemia, unspecified: Secondary | ICD-10-CM | POA: Diagnosis not present

## 2019-10-16 DIAGNOSIS — K219 Gastro-esophageal reflux disease without esophagitis: Secondary | ICD-10-CM | POA: Diagnosis not present

## 2019-10-16 DIAGNOSIS — I2510 Atherosclerotic heart disease of native coronary artery without angina pectoris: Secondary | ICD-10-CM | POA: Diagnosis not present

## 2019-10-16 DIAGNOSIS — Z9861 Coronary angioplasty status: Secondary | ICD-10-CM

## 2019-10-16 LAB — COMPREHENSIVE METABOLIC PANEL
ALT: 22 IU/L (ref 0–44)
AST: 19 IU/L (ref 0–40)
Albumin/Globulin Ratio: 2.1 (ref 1.2–2.2)
Albumin: 4.6 g/dL (ref 3.8–4.9)
Alkaline Phosphatase: 89 IU/L (ref 39–117)
BUN/Creatinine Ratio: 13 (ref 9–20)
BUN: 14 mg/dL (ref 6–24)
Bilirubin Total: 0.6 mg/dL (ref 0.0–1.2)
CO2: 22 mmol/L (ref 20–29)
Calcium: 9.5 mg/dL (ref 8.7–10.2)
Chloride: 103 mmol/L (ref 96–106)
Creatinine, Ser: 1.11 mg/dL (ref 0.76–1.27)
GFR calc Af Amer: 87 mL/min/{1.73_m2} (ref >59)
GFR calc non Af Amer: 75 mL/min/{1.73_m2} (ref >59)
Globulin, Total: 2.2 g/dL (ref 1.5–4.5)
Glucose: 98 mg/dL (ref 65–99)
Potassium: 5.1 mmol/L (ref 3.5–5.2)
Sodium: 140 mmol/L (ref 134–144)
Total Protein: 6.8 g/dL (ref 6.0–8.5)

## 2019-10-16 LAB — LIPID PANEL
Chol/HDL Ratio: 3.7 ratio (ref 0.0–5.0)
Cholesterol, Total: 139 mg/dL (ref 100–199)
HDL: 38 mg/dL — ABNORMAL LOW (ref >39)
LDL Chol Calc (NIH): 79 mg/dL (ref 0–99)
Triglycerides: 122 mg/dL (ref 0–149)
VLDL Cholesterol Cal: 22 mg/dL (ref 5–40)

## 2019-10-16 LAB — CBC
Hematocrit: 41 % (ref 37.5–51.0)
Hemoglobin: 13.8 g/dL (ref 13.0–17.7)
MCH: 29.4 pg (ref 26.6–33.0)
MCHC: 33.7 g/dL (ref 31.5–35.7)
MCV: 87 fL (ref 79–97)
Platelets: 212 10*3/uL (ref 150–450)
RBC: 4.69 x10E6/uL (ref 4.14–5.80)
RDW: 12 % (ref 11.6–15.4)
WBC: 5 10*3/uL (ref 3.4–10.8)

## 2019-10-16 LAB — TSH: TSH: 1.99 u[IU]/mL (ref 0.450–4.500)

## 2019-10-16 NOTE — Patient Instructions (Signed)
Medication Instructions:  Your physician recommends that you continue on your current medications as directed. Please refer to the Current Medication list given to you today.  *If you need a refill on your cardiac medications before your next appointment, please call your pharmacy*  Lab Work: Today - CMET, CBC, Lipids, TSH If you have labs (blood work) drawn today and your tests are completely normal, you will receive your results only by: Marland Kitchen MyChart Message (if you have MyChart) OR . A paper copy in the mail If you have any lab test that is abnormal or we need to change your treatment, we will call you to review the results.  Testing/Procedures: None ordered.   Follow-Up: At [facility], you and your health needs are our priority.  As part of our continuing mission to provide you with exceptional heart care, we have created designated Provider Care Teams.  These Care Teams include your primary Cardiologist (physician) and Advanced Practice Providers (APPs -  Physician Assistants and Nurse Practitioners) who all work together to provide you with the care you need, when you need it.  Your next appointment:   12 months  The format for your next appointment:   In Person  Provider:   You may see Larae Grooms, MD or one of the following Advanced Practice Providers on your designated Care Team:    Melina Copa, PA-C  Ermalinda Barrios, PA-C   Other Instructions Your provider recommends that you maintain 150 minutes per week of moderate aerobic activity.

## 2019-10-17 ENCOUNTER — Other Ambulatory Visit: Payer: Self-pay

## 2019-10-17 DIAGNOSIS — E785 Hyperlipidemia, unspecified: Secondary | ICD-10-CM

## 2019-10-17 MED ORDER — ATORVASTATIN CALCIUM 20 MG PO TABS: 20.0000 mg | ORAL_TABLET | Freq: Every day | ORAL | 3 refills | Status: DC

## 2019-10-29 ENCOUNTER — Other Ambulatory Visit: Payer: Self-pay

## 2019-10-29 MED ORDER — CLOPIDOGREL BISULFATE 75 MG PO TABS: 75.0000 mg | ORAL_TABLET | Freq: Every day | ORAL | 3 refills | Status: DC

## 2020-01-14 ENCOUNTER — Other Ambulatory Visit: Payer: Self-pay

## 2020-01-14 ENCOUNTER — Other Ambulatory Visit: Payer: Managed Care, Other (non HMO)

## 2020-01-14 DIAGNOSIS — E785 Hyperlipidemia, unspecified: Secondary | ICD-10-CM

## 2020-01-15 ENCOUNTER — Other Ambulatory Visit: Payer: Self-pay

## 2020-01-15 DIAGNOSIS — E785 Hyperlipidemia, unspecified: Secondary | ICD-10-CM

## 2020-01-15 LAB — LIPID PANEL
Chol/HDL Ratio: 3.6 ratio (ref 0.0–5.0)
Cholesterol, Total: 136 mg/dL (ref 100–199)
HDL: 38 mg/dL — ABNORMAL LOW (ref >39)
LDL Chol Calc (NIH): 80 mg/dL (ref 0–99)
Triglycerides: 98 mg/dL (ref 0–149)
VLDL Cholesterol Cal: 18 mg/dL (ref 5–40)

## 2020-01-15 MED ORDER — ATORVASTATIN CALCIUM 40 MG PO TABS: 40.0000 mg | ORAL_TABLET | Freq: Every day | ORAL | 3 refills | Status: DC

## 2020-01-28 ENCOUNTER — Other Ambulatory Visit: Payer: Self-pay

## 2020-01-28 ENCOUNTER — Telehealth: Payer: Self-pay | Admitting: Interventional Cardiology

## 2020-01-28 ENCOUNTER — Emergency Department (HOSPITAL_COMMUNITY): Payer: Managed Care, Other (non HMO)

## 2020-01-28 ENCOUNTER — Emergency Department (HOSPITAL_COMMUNITY)
Admission: EM | Admit: 2020-01-28 | Discharge: 2020-01-28 | Disposition: A | Discharge status: Home / Self Care | Payer: Managed Care, Other (non HMO) | Attending: Emergency Medicine | Admitting: Emergency Medicine

## 2020-01-28 ENCOUNTER — Encounter (HOSPITAL_COMMUNITY): Payer: Self-pay | Admitting: Emergency Medicine

## 2020-01-28 DIAGNOSIS — R0789 Other chest pain: Secondary | ICD-10-CM | POA: Diagnosis not present

## 2020-01-28 DIAGNOSIS — R0602 Shortness of breath: Secondary | ICD-10-CM | POA: Insufficient documentation

## 2020-01-28 DIAGNOSIS — R000 Tachycardia, unspecified: Secondary | ICD-10-CM | POA: Insufficient documentation

## 2020-01-28 LAB — CBC
HCT: 41.3 % (ref 39.0–52.0)
Hemoglobin: 13.9 g/dL (ref 13.0–17.0)
MCH: 29.6 pg (ref 26.0–34.0)
MCHC: 33.7 g/dL (ref 30.0–36.0)
MCV: 87.9 fL (ref 80.0–100.0)
Platelets: 216 10*3/uL (ref 150–400)
RBC: 4.7 MIL/uL (ref 4.22–5.81)
RDW: 12.5 % (ref 11.5–15.5)
WBC: 6.5 10*3/uL (ref 4.0–10.5)
nRBC: 0 % (ref 0.0–0.2)

## 2020-01-28 LAB — BASIC METABOLIC PANEL
Anion gap: 10 (ref 5–15)
BUN: 15 mg/dL (ref 6–20)
CO2: 25 mmol/L (ref 22–32)
Calcium: 9.2 mg/dL (ref 8.9–10.3)
Chloride: 102 mmol/L (ref 98–111)
Creatinine, Ser: 1.12 mg/dL (ref 0.61–1.24)
GFR calc Af Amer: >60 mL/min (ref >60)
GFR calc non Af Amer: >60 mL/min (ref >60)
Glucose, Bld: 173 mg/dL — ABNORMAL HIGH (ref 70–99)
Potassium: 4.1 mmol/L (ref 3.5–5.1)
Sodium: 137 mmol/L (ref 135–145)

## 2020-01-28 LAB — TROPONIN I (HIGH SENSITIVITY)
Troponin I (High Sensitivity): 4 ng/L (ref <18)
Troponin I (High Sensitivity): 3 ng/L (ref <18)

## 2020-01-28 LAB — PROTIME-INR
INR: 1 (ref 0.8–1.2)
Prothrombin Time: 12.7 seconds (ref 11.4–15.2)

## 2020-01-28 MED ORDER — SODIUM CHLORIDE 0.9% FLUSH: 3.0000 mL | Freq: Once | INTRAVENOUS | Status: DC

## 2020-01-28 NOTE — Discharge Instructions (Addendum)
1. Medications: usual home medications 2. Treatment: rest, drink plenty of fluids,  3. Follow Up: Please followup with your cardiologist in 2 days; please return to the ER for return of high heart rate, palpitations, chest pain, shortness of breath or any new or worsening symptoms.

## 2020-01-28 NOTE — ED Provider Notes (Signed)
Medical screening examination/treatment/procedure(s) were conducted as a shared visit with non-physician practitioner(s) and myself.  I personally evaluated the patient during the encounter.  Patient states he has history of coronary disease with stent.  Patient states that tonight around 930 he had an acute episode of palpitations and that his heart rate on his apple watch was reading 190.  Try to do an EKG but would not do because his heart rate was too fast.  He states that this resolved on its own is asymptomatic until around 09-1129 when he was sleeping.  He woke up with chest pressure and shortness of breath.  Patient states he had no palpitations during this time.  Presented here for further evaluation.  At this time patient is asymptomatic. EKG is unremarkable.  Labs are still pending. Suspect patient had something like SVT earlier since it was so consistently at 190.  This resolved he woke up later with some chest pressure this could be related to the initial event with his history of coronary disease I think he at least deserves a troponin rule out.  Would recommend discussion this with cardiology prior to discharge to make sure they are okay with it.  He already has a cardiologist who he can follow-up with for monitoring.  EKG Interpretation  Date/Time:  Monday January 28 2020 02:15:45 EST Ventricular Rate:  82 PR Interval:    QRS Duration: 77 QT Interval:  348 QTC Calculation: 407 R Axis:   32 Text Interpretation: Sinus rhythm Probable anteroseptal infarct, old No significant change since last tracing Confirmed by Marily Memos [phone]) on 01/28/2020 2:41:25 AM     Jaianna Nicoll, Barbara Cower, MD 01/28/20 [phone]

## 2020-01-28 NOTE — ED Triage Notes (Signed)
Patient reports central chest tightness/SOB this evening , denies emesis or diaphoresis , he took 2 NTG sl prior to arrival with relief , history of CAD/coronary stents , his cardiologist is Dr. Eldridge Dace.

## 2020-01-28 NOTE — Telephone Encounter (Signed)
Called and spoke to the patient. He was in the ER this morning with tachycardia. They wanted patient to be seen this week. Appointment scheduled with Dr. Eldridge Dace in the AM.

## 2020-01-28 NOTE — Telephone Encounter (Signed)
New Message:   Pt was seen in [facility] ER early this morning. He said he was told to call and make an appt for this week. He said he wanted the nurse to please give him a call please. I made him an appt for tomorrow(01-29-20)

## 2020-01-28 NOTE — ED Notes (Signed)
Patient verbalizes understanding of discharge instructions. Opportunity for questioning and answers were provided. Armband removed by staff, pt discharged from ED. Pt. ambulatory and discharged home.  

## 2020-01-28 NOTE — ED Provider Notes (Signed)
[facility] EMERGENCY DEPARTMENT Provider Note   CSN: [id number] Arrival date & time: 01/28/20  0005     History Chief Complaint  Patient presents with  . Chest Pain    Marvin Soto is a 54 y.o. male with a hx of coronary artery disease s/p stent placement by Dr. Scarlette Calico, angina, dyslipidemia, GERD, hypertension presents to the Emergency Department complaining of acute episode of tachycardia with associated central chest pressure, lightheadedness and shortness of breath onset around 9 PM.  Patient reports he was at rest, drinking a beer when he developed the symptoms.  Patient reports he was wearing his apple watch which recorded his heart rate at 190.  Patient reports taking 1 nitroglycerin with improvement in symptoms.  He reports elevated heart rate for approximately 10 minutes with resolution.  He reports feeling "off" afterwards but no return of tachycardia.  Patient reports around 11:30 PM he woke from bed with severe shortness of breath.  At the time he put his watch on, his heart rate was normal.  He reports his blood pressure was also normal.  He states he took a second nitroglycerin which did not significantly help his symptoms.  Patient reports his symptoms are improved now but he still does not feel normal.  He reports he has been in her normal state of health until this evening.  No exertional symptoms, sick contacts, fever, chills, cough, dental pain, vomiting or other symptoms.  No aggravating factors for either episode.  Patient reports symptoms prestent were consistently exertional.  The history is provided by the patient and medical records. No language interpreter was used.    HPI: A 54 year old patient with a history of hypertension and hypercholesterolemia presents for evaluation of chest pain. Initial onset of pain was approximately 3-6 hours ago. The patient's chest pain is described as heaviness/pressure/tightness, is not worse with exertion and is  relieved by nitroglycerin. The patient's chest pain is middle- or left-sided, is not well-localized, is not sharp and does not radiate to the arms/jaw/neck. The patient does not complain of nausea and denies diaphoresis. The patient has no history of stroke, has no history of peripheral artery disease, has not smoked in the past 90 days, denies any history of treated diabetes, has no relevant family history of coronary artery disease (first degree relative at less than age 56) and does not have an elevated BMI (>=30).   Past Medical History:  Diagnosis Date  . Anginal pain ([facility])   . Coronary artery disease   . Dyslipidemia   . GERD (gastroesophageal reflux disease)   . Hypertension     Patient Active Problem List   Diagnosis Date Noted  . Hyperlipidemia 01/13/2016  . CAD S/P percutaneous coronary angioplasty- DES to mid LAD 06/10/15 06/11/2015  . HTN (hypertension) 06/11/2015  . Abnormal stress test 06/10/2015  . GERD (gastroesophageal reflux disease) 05/28/2015  . Chest pain on exertion 05/28/2015  . Essential hypertension 05/28/2015    Past Surgical History:  Procedure Laterality Date  . ANTERIOR CRUCIATE LIGAMENT REPAIR Left 1996  . CARDIAC CATHETERIZATION N/A 06/10/2015   Procedure: Left Heart Cath and Coronary Angiography;  Surgeon: Jettie Booze, MD;  Location: [facility];  Service: Cardiovascular;  Laterality: N/A;  . CARDIAC CATHETERIZATION N/A 06/10/2015   Procedure: Coronary Stent Intervention;  Surgeon: Jettie Booze, MD;  Location: [facility];  Service: Cardiovascular;  Laterality: N/A;  . CORONARY ANGIOPLASTY WITH STENT PLACEMENT  06/10/2015  . MASTECTOMY  Family History  Problem Relation Age of Onset  . Heart attack Father   . Stroke Father   . Multiple sclerosis Sister   . Healthy Mother   . Healthy Sister   . Healthy Sister     Social History   Tobacco Use  . Smoking status: Never Smoker  . Smokeless tobacco: Never Used    Substance Use Topics  . Alcohol use: Yes    Alcohol/week: 3.0 standard drinks    Types: 3 Cans of beer per week  . Drug use: No    Home Medications Prior to Admission medications   Medication Sig Start Date End Date Taking? Authorizing Provider  aspirin EC 81 MG tablet Take 81 mg by mouth daily.   Yes [provider]  atorvastatin (LIPITOR) 40 MG tablet Take 1 tablet (40 mg total) by mouth daily. 01/15/20  Yes Dyann Kief, PA-C  Calcium Carb-Cholecalciferol (CALCIUM-VITAMIN D) 600-400 MG-UNIT TABS Take 1 tablet by mouth daily.   Yes [provider]  cetirizine (ZYRTEC) 10 MG tablet Take 10 mg by mouth daily as needed for allergies.    Yes [provider]  clopidogrel (PLAVIX) 75 MG tablet Take 1 tablet (75 mg total) by mouth daily. 10/29/19  Yes [name], MD  esomeprazole (NEXIUM) 20 MG capsule Take 1 capsule (20 mg total) by mouth daily at 12 noon. 09/18/18  Yes [name], MD  losartan (COZAAR) 100 MG tablet Take 100 mg by mouth daily. 05/08/15  Yes [provider]  nitroGLYCERIN (NITROSTAT) 0.4 MG SL tablet Place 1 tablet (0.4 mg total) under the tongue every 5 (five) minutes as needed for chest pain. 07/14/16  Yes [name], MD    Allergies    Patient has no known allergies.  Review of Systems   Review of Systems  Constitutional: Negative for appetite change, diaphoresis, fatigue, fever and unexpected weight change.  HENT: Negative for mouth sores.   Eyes: Negative for visual disturbance.  Respiratory: Positive for shortness of breath. Negative for cough, chest tightness and wheezing.   Cardiovascular: Positive for chest pain.  Gastrointestinal: Negative for abdominal pain, constipation, diarrhea, nausea and vomiting.  Endocrine: Negative for polydipsia, polyphagia and polyuria.  Genitourinary: Negative for dysuria, frequency, hematuria and urgency.  Musculoskeletal: Negative for back pain and neck stiffness.   Skin: Negative for rash.  Allergic/Immunologic: Negative for immunocompromised state.  Neurological: Negative for syncope, light-headedness and headaches.  Hematological: Does not bruise/bleed easily.  Psychiatric/Behavioral: Negative for sleep disturbance. The patient is not nervous/anxious.     Physical Exam Updated Vital Signs BP (!) 128/91   Pulse 82   Temp 98 F (36.7 C) (Oral)   Resp 19   Ht 5\' 11"  (1.803 m)   Wt 105 kg   SpO2 97%   BMI 32.29 kg/m   Physical Exam Vitals and nursing note reviewed.  Constitutional:      General: He is not in acute distress.    Appearance: He is not diaphoretic.  HENT:     Head: Normocephalic.  Eyes:     General: No scleral icterus.    Conjunctiva/sclera: Conjunctivae normal.  Cardiovascular:     Rate and Rhythm: Normal rate and regular rhythm.     Pulses: Normal pulses.          Radial pulses are 2+ on the right side and 2+ on the left side.  Pulmonary:     Effort: No tachypnea, accessory muscle usage, prolonged expiration, respiratory distress  or retractions.     Breath sounds: No stridor.     Comments: Equal chest rise. No increased work of breathing. Abdominal:     General: There is no distension.     Palpations: Abdomen is soft.     Tenderness: There is no abdominal tenderness. There is no guarding or rebound.  Musculoskeletal:     Cervical back: Normal range of motion.     Right lower leg: No edema.     Left lower leg: No edema.     Comments: Moves all extremities equally and without difficulty.  Skin:    General: Skin is warm and dry.     Capillary Refill: Capillary refill takes less than 2 seconds.  Neurological:     Mental Status: He is alert.     GCS: GCS eye subscore is 4. GCS verbal subscore is 5. GCS motor subscore is 6.     Comments: Speech is clear and goal oriented.  Psychiatric:        Mood and Affect: Mood normal.     ED Results / Procedures / Treatments   Labs (all labs ordered are listed, but only  abnormal results are displayed) Labs Reviewed  BASIC METABOLIC PANEL - Abnormal; Notable for the following components:      Result Value   Glucose, Bld 173 (*)    All other components within normal limits  CBC  PROTIME-INR  TROPONIN I (HIGH SENSITIVITY)  TROPONIN I (HIGH SENSITIVITY)    EKG EKG Interpretation  Date/Time:  Monday January 28 2020 02:15:45 EST Ventricular Rate:  82 PR Interval:    QRS Duration: 77 QT Interval:  348 QTC Calculation: 407 R Axis:   32 Text Interpretation: Sinus rhythm Probable anteroseptal infarct, old No significant change since last tracing Confirmed by Marily Memos [phone]) on 01/28/2020 2:41:25 AM   Radiology DG Chest 2 View  Result Date: 01/28/2020 CLINICAL DATA:  Chest pain EXAM: CHEST - 2 VIEW COMPARISON:  June 23, 2009 FINDINGS: The heart size and mediastinal contours are within normal limits. Both lungs are clear. The visualized skeletal structures are unremarkable. IMPRESSION: No active cardiopulmonary disease. Electronically Signed   By: Jonna Clark M.D.   On: 01/28/2020 00:34    Procedures Procedures (including critical care time)  Medications Ordered in ED Medications  sodium chloride flush (NS) 0.9 % injection 3 mL (has no administration in time range)    ED Course  I have reviewed the triage vital signs and the nursing notes.  Pertinent labs & imaging results that were available during my care of the patient were reviewed by me and considered in my medical decision making (see chart for details).  Clinical Course as of Jan 28 520  Mon Jan 28, 2020  8891 Initial and delta troponin are negative.  Troponin I (High Sensitivity): 3 [HM]  [id number] No electrolyte abnormalities  Sodium: 137 [HM]  0431 Discussed with Dr. Santiago Glad, cardiology, who feels patient can be discharged with close outpatient follow-up.  Strict return precautions should be given.   [HM]    Clinical Course User Index [HM] Kaidin Boehle, Boyd Kerbs   MDM  Rules/Calculators/A&P HEAR Score: 5                    Presents with chest pain and pressure along with tachycardia.  My evaluation patient is no longer tachycardic.  EKG is without acute ischemia.  Initial troponin negative.  No electrolyte abnormalities.  Delta troponin pending.  4:15 AM Delta  troponin negative.  Patient remains asymptomatic at this time.  No arrhythmias while here in the emergency department.  Given symptoms and regularity of rate more likely be SVT than A. fib or V. tach.  Will consult with cardiology for recommendations.  Patient does have outpatient cardiologist.  4:32 AM Discussed with Dr. Liane Comber, cardiology who is comfortable with discharge home and outpatient follow-up.  Have discussed this with the patient and have recommended follow-up with his cardiologist within the next 2 days.  He is to return immediately for return of symptoms, exertional symptoms or other concerns.  The patient was discussed with and seen by Dr. Clayborne Dana who agrees with the treatment plan.   Final Clinical Impression(s) / ED Diagnoses Final diagnoses:  Tachycardia  Chest pressure  Shortness of breath    Rx / DC Orders ED Discharge Orders    None       Ebelin Dillehay, Boyd Kerbs 01/28/20 0522    Mesner, Barbara Cower, MD 01/28/20 [phone]

## 2020-01-29 ENCOUNTER — Encounter: Payer: Self-pay | Admitting: Interventional Cardiology

## 2020-01-29 ENCOUNTER — Ambulatory Visit: Payer: Managed Care, Other (non HMO) | Admitting: Interventional Cardiology

## 2020-01-29 VITALS — BP 124/80 | HR 86 | Ht 71.0 in | Wt 222.0 lb

## 2020-01-29 DIAGNOSIS — R000 Tachycardia, unspecified: Secondary | ICD-10-CM

## 2020-01-29 DIAGNOSIS — Z9861 Coronary angioplasty status: Secondary | ICD-10-CM

## 2020-01-29 DIAGNOSIS — I10 Essential (primary) hypertension: Secondary | ICD-10-CM | POA: Diagnosis not present

## 2020-01-29 DIAGNOSIS — R002 Palpitations: Secondary | ICD-10-CM

## 2020-01-29 DIAGNOSIS — I2510 Atherosclerotic heart disease of native coronary artery without angina pectoris: Secondary | ICD-10-CM

## 2020-01-29 DIAGNOSIS — E785 Hyperlipidemia, unspecified: Secondary | ICD-10-CM

## 2020-01-29 LAB — COMPREHENSIVE METABOLIC PANEL
ALT: 24 IU/L (ref 0–44)
AST: 16 IU/L (ref 0–40)
Albumin/Globulin Ratio: 2 (ref 1.2–2.2)
Albumin: 4.7 g/dL (ref 3.8–4.9)
Alkaline Phosphatase: 93 IU/L (ref 39–117)
BUN/Creatinine Ratio: 12 (ref 9–20)
BUN: 13 mg/dL (ref 6–24)
Bilirubin Total: 1 mg/dL (ref 0.0–1.2)
CO2: 23 mmol/L (ref 20–29)
Calcium: 9.4 mg/dL (ref 8.7–10.2)
Chloride: 101 mmol/L (ref 96–106)
Creatinine, Ser: 1.13 mg/dL (ref 0.76–1.27)
GFR calc Af Amer: 85 mL/min/{1.73_m2} (ref >59)
GFR calc non Af Amer: 74 mL/min/{1.73_m2} (ref >59)
Globulin, Total: 2.3 g/dL (ref 1.5–4.5)
Glucose: 93 mg/dL (ref 65–99)
Potassium: 4.5 mmol/L (ref 3.5–5.2)
Sodium: 137 mmol/L (ref 134–144)
Total Protein: 7 g/dL (ref 6.0–8.5)

## 2020-01-29 LAB — LIPID PANEL
Chol/HDL Ratio: 4.2 ratio (ref 0.0–5.0)
Cholesterol, Total: 151 mg/dL (ref 100–199)
HDL: 36 mg/dL — ABNORMAL LOW (ref >39)
LDL Chol Calc (NIH): 91 mg/dL (ref 0–99)
Triglycerides: 134 mg/dL (ref 0–149)
VLDL Cholesterol Cal: 24 mg/dL (ref 5–40)

## 2020-01-29 LAB — TSH: TSH: 1.58 u[IU]/mL (ref 0.450–4.500)

## 2020-01-29 NOTE — Patient Instructions (Addendum)
Medication Instructions:  Your physician recommends that you continue on your current medications as directed. Please refer to the Current Medication list given to you today.  *If you need a refill on your cardiac medications before your next appointment, please call your pharmacy*   Lab Work: Your physician recommends that you return for a FASTING lipid profile, complete metabolic panel, and thyroid function panel  If you have labs (blood work) drawn today and your tests are completely normal, you will receive your results only by: Marland Kitchen MyChart Message (if you have MyChart) OR . A paper copy in the mail If you have any lab test that is abnormal or we need to change your treatment, we will call you to review the results.   Testing/Procedures: None ordered   Follow-Up: Please send [place] your rhythm strip and we will let you know about follow-up

## 2020-01-29 NOTE — Progress Notes (Signed)
Cardiology Office Note   Date:  01/29/2020   ID:  Marvin Soto, DOB Nov 25, 1966, MRN [id number]  PCP:  [name], MD    No chief complaint on file.  CAD  Wt Readings from Last 3 Encounters:  01/29/20 222 lb (100.7 kg)  01/28/20 231 lb 7.7 oz (105 kg)  10/16/19 215 lb (97.5 kg)       History of Present Illness: Marvin Soto is a 54 y.o. male  with h/o HTN and family h/o CAD (father with MI at age 68), admitted for elective LHC on 06/10/2015 after undergoing an abnormal NST conducted for chest pain eval. He was found to have a 95% stenosed mid LAD, successfully treated with PCI + DES. LVF was normal with EF of 55-65%. He was placed on DAPT with ASA + Plavix. He was also continued on a statin and ARB. Low dose BB therapy with Lopressor was added. His Nexium was changed to Protonix given concomitant use of Plavix. Imdur was discontinued as it was felt his angina will resolve post PCI.  2018 stress test showed:  1. Normal exercise tolerance.  2. No evidence for ischemia by ST segment analysis.   In the past, "He has pushed himself with a kayaking tournament.  He was exercising vigorously as well.  Did not lose weight but did not even feel a twinge.    He has had some GERD.  He feels that the nexium worked better.  He bruises a lot less than before.   He was taking Vyvanse occasionally, but noticed his HR was higher.  He has stopped Vyvanse sinece that time."  Since the last visit, he had an episode of palpitations and went to the ER.  He had palpitations at 930 PM, lasting a few minutes and resolved with NTG. Apple watch showed HR 190s.  He was unable to do an ECG.  He had an episode like this in 09/2019 and it resolved on its own, after a few minutes.  At 11 PM, he woke up with [facility] and some mild chest pressure.  He went to the ER.    He did not get an ECG on Apple watch when HR was 190s.       Past Medical History:  Diagnosis Date  . Anginal pain ([facility])   .  Coronary artery disease   . Dyslipidemia   . GERD (gastroesophageal reflux disease)   . Hypertension     Past Surgical History:  Procedure Laterality Date  . ANTERIOR CRUCIATE LIGAMENT REPAIR Left 1996  . CARDIAC CATHETERIZATION N/A 06/10/2015   Procedure: Left Heart Cath and Coronary Angiography;  Surgeon: [name], MD;  Location: [facility];  Service: Cardiovascular;  Laterality: N/A;  . CARDIAC CATHETERIZATION N/A 06/10/2015   Procedure: Coronary Stent Intervention;  Surgeon: [name], MD;  Location: [facility] INVASIVE [facility] LAB;  Service: Cardiovascular;  Laterality: N/A;  . CORONARY ANGIOPLASTY WITH STENT PLACEMENT  06/10/2015  . MASTECTOMY       Current Outpatient Medications  Medication Sig Dispense Refill  . aspirin EC 81 MG tablet Take 81 mg by mouth daily.    Marland Kitchen atorvastatin (LIPITOR) 40 MG tablet Take 1 tablet (40 mg total) by mouth daily. 90 tablet 3  . Calcium Carb-Cholecalciferol (CALCIUM-VITAMIN D) 600-400 MG-UNIT TABS Take 1 tablet by mouth daily.    . cetirizine (ZYRTEC) 10 MG tablet Take 10 mg by mouth daily as needed for allergies.     Marland Kitchen clopidogrel (  PLAVIX) 75 MG tablet Take 1 tablet (75 mg total) by mouth daily. 90 tablet 3  . esomeprazole (NEXIUM) 20 MG capsule Take 1 capsule (20 mg total) by mouth daily at 12 noon.    Marland Kitchen losartan (COZAAR) 100 MG tablet Take 100 mg by mouth daily.  5  . nitroGLYCERIN (NITROSTAT) 0.4 MG SL tablet Place 1 tablet (0.4 mg total) under the tongue every 5 (five) minutes as needed for chest pain. 25 tablet 3   No current facility-administered medications for this visit.    Allergies:   Patient has no known allergies.    Social History:  The patient  reports that he has never smoked. He has never used smokeless tobacco. He reports current alcohol use of about 3.0 standard drinks of alcohol per week. He reports that he does not use drugs.   Family History:  The patient's family history includes Healthy in his mother,  sister, and sister; Heart attack in his father; Multiple sclerosis in his sister; Stroke in his father.    ROS:  Please see the history of present illness.   Otherwise, review of systems are positive for palpitations.   All other systems are reviewed and negative.    PHYSICAL EXAM: VS:  BP 124/80   Pulse 86   Ht 5\' 11"  (1.803 m)   Wt 222 lb (100.7 kg)   SpO2 99%   BMI 30.96 kg/m  , BMI Body mass index is 30.96 kg/m. GEN: Well nourished, well developed, in no acute distress  HEENT: normal  Neck: no JVD, carotid bruits, or masses Cardiac: RRR; no murmurs, rubs, or gallops,no edema  Respiratory:  clear to auscultation bilaterally, normal work of breathing GI: soft, nontender, nondistended, + BS MS: no deformity or atrophy  Skin: warm and dry, no rash Neuro:  Strength and sensation are intact Psych: euthymic mood, full affect   EKG:   The ekg ordered 01/28/2020 demonstrates NSR, no ST changes   Recent Labs: 10/16/2019: ALT 22; TSH 1.990 01/28/2020: BUN 15; Creatinine, Ser 1.12; Hemoglobin 13.9; Platelets 216; Potassium 4.1; Sodium 137   Lipid Panel    Component Value Date/Time   CHOL 136 01/14/2020 1233   TRIG 98 01/14/2020 1233   HDL 38 (L) 01/14/2020 1233   CHOLHDL 3.6 01/14/2020 1233   CHOLHDL 3.4 02/23/2016 0802   VLDL 13 02/23/2016 0802   LDLCALC 80 01/14/2020 1233     Other studies Reviewed: Additional studies/ records that were reviewed today with results demonstrating: HDL 38, TC 136 TG 98 in 12/2019 .  High-sensitivity troponins were negative after he had his palpitations.  ECG at that time did not show any ischemic changes.   ASSESSMENT AND PLAN:  1. CAD: No angina. Continue aggressive secondary prevention.  2. Palpitations: Concerning sx for SVT.  Planned for 14 day Zio monitor- but then he found an ECG that he was able to say after his apple watch when his heart rate was 187 bpm.  Will check with EP regarding possible treatment options.. Sx typically after  eating something sweet and drinking alcohol.  I think his symptoms are more related to his tachycardia as opposed to any ischemic cause. 3. HTN: The current medical regimen is effective;  continue present plan and medications. 4. Hyperlipidemia:  Atorvastatin increased to 40 mg.  Will need labs checked in a few months.   Current medicines are reviewed at length with the patient today.  The patient concerns regarding his medicines were addressed.  The following  changes have been made:  No change  Labs/ tests ordered today include: Cmet lipids TSH in 03/2020 No orders of the defined types were placed in this encounter.   Recommend 150 minutes/week of aerobic exercise Low fat, low carb, high fiber diet recommended  Disposition:   FU with EP based on their recs from ECG tracing   Signed, Larae Grooms, MD  01/29/2020 9:56 AM    [facility], [place], [place]  [address] Phone: [phone]; Fax: [phone]

## 2020-01-30 NOTE — Telephone Encounter (Signed)
Referral placed to see Dr. Johney Frame. Patient made aware that he will be contacted to schedule.

## 2020-02-01 ENCOUNTER — Ambulatory Visit: Payer: Managed Care, Other (non HMO) | Attending: Internal Medicine

## 2020-02-01 DIAGNOSIS — Z23 Encounter for immunization: Secondary | ICD-10-CM | POA: Insufficient documentation

## 2020-02-01 NOTE — Progress Notes (Signed)
   Covid-19 Vaccination Clinic  Name:  Marvin Soto    MRN: [id number] DOB: 23-Jan-1966  02/01/2020  [name] was observed post Covid-19 immunization for 15 minutes without incident. He was provided with Vaccine Information Sheet and instruction to access the V-Safe system.   [name] was instructed to call [phone] with any severe reactions post vaccine: Marland Kitchen Difficulty breathing  . Swelling of face and throat  . A fast heartbeat  . A bad rash all over body  . Dizziness and weakness

## 2020-02-06 ENCOUNTER — Other Ambulatory Visit: Payer: Self-pay

## 2020-02-06 ENCOUNTER — Emergency Department (HOSPITAL_COMMUNITY): Payer: Managed Care, Other (non HMO)

## 2020-02-06 ENCOUNTER — Emergency Department (HOSPITAL_COMMUNITY)
Admission: EM | Admit: 2020-02-06 | Discharge: 2020-02-06 | Disposition: A | Discharge status: Home / Self Care | Payer: Managed Care, Other (non HMO) | Attending: Emergency Medicine | Admitting: Emergency Medicine

## 2020-02-06 ENCOUNTER — Encounter (HOSPITAL_COMMUNITY): Payer: Self-pay | Admitting: Emergency Medicine

## 2020-02-06 DIAGNOSIS — R002 Palpitations: Secondary | ICD-10-CM | POA: Diagnosis present

## 2020-02-06 DIAGNOSIS — R0789 Other chest pain: Secondary | ICD-10-CM | POA: Insufficient documentation

## 2020-02-06 DIAGNOSIS — I2510 Atherosclerotic heart disease of native coronary artery without angina pectoris: Secondary | ICD-10-CM | POA: Diagnosis not present

## 2020-02-06 DIAGNOSIS — I10 Essential (primary) hypertension: Secondary | ICD-10-CM | POA: Diagnosis not present

## 2020-02-06 DIAGNOSIS — Z79899 Other long term (current) drug therapy: Secondary | ICD-10-CM | POA: Insufficient documentation

## 2020-02-06 DIAGNOSIS — I4891 Unspecified atrial fibrillation: Secondary | ICD-10-CM | POA: Diagnosis not present

## 2020-02-06 DIAGNOSIS — Z7982 Long term (current) use of aspirin: Secondary | ICD-10-CM | POA: Insufficient documentation

## 2020-02-06 LAB — CBC
HCT: 43.3 % (ref 39.0–52.0)
Hemoglobin: 14.5 g/dL (ref 13.0–17.0)
MCH: 29.7 pg (ref 26.0–34.0)
MCHC: 33.5 g/dL (ref 30.0–36.0)
MCV: 88.5 fL (ref 80.0–100.0)
Platelets: 238 10*3/uL (ref 150–400)
RBC: 4.89 MIL/uL (ref 4.22–5.81)
RDW: 12.2 % (ref 11.5–15.5)
WBC: 6.7 10*3/uL (ref 4.0–10.5)
nRBC: 0 % (ref 0.0–0.2)

## 2020-02-06 LAB — PROTIME-INR
INR: 1 (ref 0.8–1.2)
Prothrombin Time: 13 seconds (ref 11.4–15.2)

## 2020-02-06 LAB — BASIC METABOLIC PANEL
Anion gap: 11 (ref 5–15)
BUN: 14 mg/dL (ref 6–20)
CO2: 23 mmol/L (ref 22–32)
Calcium: 9.5 mg/dL (ref 8.9–10.3)
Chloride: 103 mmol/L (ref 98–111)
Creatinine, Ser: 1.25 mg/dL — ABNORMAL HIGH (ref 0.61–1.24)
GFR calc Af Amer: >60 mL/min (ref >60)
GFR calc non Af Amer: >60 mL/min (ref >60)
Glucose, Bld: 129 mg/dL — ABNORMAL HIGH (ref 70–99)
Potassium: 3.6 mmol/L (ref 3.5–5.1)
Sodium: 137 mmol/L (ref 135–145)

## 2020-02-06 LAB — TROPONIN I (HIGH SENSITIVITY): Troponin I (High Sensitivity): 3 ng/L (ref <18)

## 2020-02-06 MED ORDER — RIVAROXABAN 20 MG PO TABS
20.0000 mg | ORAL_TABLET | Freq: Once | ORAL | Status: AC
  Administered 2020-02-06: 20 mg via ORAL
  Filled 2020-02-06: qty 1

## 2020-02-06 MED ORDER — METOPROLOL SUCCINATE ER 25 MG PO TB24: 25.0000 mg | ORAL_TABLET | Freq: Two times a day (BID) | ORAL | 0 refills | Status: DC

## 2020-02-06 MED ORDER — RIVAROXABAN (XARELTO) EDUCATION KIT FOR AFIB PATIENTS
PACK | Freq: Once | Status: AC
  Filled 2020-02-06: qty 1

## 2020-02-06 MED ORDER — SODIUM CHLORIDE 0.9% FLUSH: 3.0000 mL | Freq: Once | INTRAVENOUS | Status: DC

## 2020-02-06 MED ORDER — DILTIAZEM LOAD VIA INFUSION
10.0000 mg | Freq: Once | INTRAVENOUS | Status: AC
  Administered 2020-02-06: 10 mg via INTRAVENOUS
  Filled 2020-02-06: qty 10

## 2020-02-06 MED ORDER — RIVAROXABAN 20 MG PO TABS: 20.0000 mg | ORAL_TABLET | Freq: Every day | ORAL | 0 refills | Status: DC

## 2020-02-06 MED ORDER — DILTIAZEM HCL-DEXTROSE 125-5 MG/125ML-% IV SOLN (PREMIX)
5.0000 mg/h | INTRAVENOUS | Status: DC
  Administered 2020-02-06: 5 mg/h via INTRAVENOUS
  Filled 2020-02-06: qty 125

## 2020-02-06 NOTE — ED Triage Notes (Signed)
Patient reports palpitations this evening  with rapid HR = 160+ at triage , mild SOB , denies chest pain , his cardiologist is Dr. Isabel Caprice , history of CAD / Coronary stents .

## 2020-02-06 NOTE — Discharge Instructions (Addendum)
Take your regular medications and add Xarelto and Toprol as prescribed tonight.   Keep your scheduled appointment with cardiology next week. If your symptoms recur, you have chest pain, difficulty breathing, or new concern, please return to the emergency department for further evaluation.

## 2020-02-06 NOTE — ED Notes (Signed)
Pt was discharged from the ED. Pt read and understood discharge paperwork. Pt had vital signs completed. Pt conscious, breathing, and A&Ox4. No distress noted. Pt speaking in complete sentences. Pt ambulated out of the ED with a smooth and steady gait. E-signature not available.  

## 2020-02-06 NOTE — ED Provider Notes (Signed)
[facility] EMERGENCY DEPARTMENT Provider Note   CSN: [id number] Arrival date & time: 02/06/20  2002     History Chief Complaint  Patient presents with  . Palpitations    HR=160+    Marvin Soto is a 54 y.o. male.  Patient with history of CAD (stent in 2016), HTN, HLD, GERD presents with onset fast-rate palpitations around 7:00 pm tonight. No chest pain, SOB, nausea, diaphoresis. He was seen on 01/28/20 for similar symptoms after having a brief episode of palpitations that resolved spontaneously, and subsequently having chest pressure prompting ED visit. He followed up with his cardiologist Eldridge Dace) who has referred him the EP. First visit is next week. He was doing well with diet and exercise until tonight when he ate a high fat meal. He went home, went upstairs and had onset of recurrent palpitations without chest pressure/pain, SOB, nausea, that did not resolve as they did previously. They continue on arrival to the ED.   The history is provided by the patient. No language interpreter was used.  Palpitations Associated symptoms: no chest pain, no diaphoresis, no nausea and no shortness of breath        Past Medical History:  Diagnosis Date  . Anginal pain (HCC)   . Coronary artery disease   . Dyslipidemia   . GERD (gastroesophageal reflux disease)   . Hypertension     Patient Active Problem List   Diagnosis Date Noted  . Hyperlipidemia 01/13/2016  . CAD S/P percutaneous coronary angioplasty- DES to mid LAD 06/10/15 06/11/2015  . HTN (hypertension) 06/11/2015  . Abnormal stress test 06/10/2015  . GERD (gastroesophageal reflux disease) 05/28/2015  . Chest pain on exertion 05/28/2015  . Essential hypertension 05/28/2015    Past Surgical History:  Procedure Laterality Date  . ANTERIOR CRUCIATE LIGAMENT REPAIR Left 1996  . CARDIAC CATHETERIZATION N/A 06/10/2015   Procedure: Left Heart Cath and Coronary Angiography;  Surgeon: [name], MD;   Location: [facility] INVASIVE CV LAB;  Service: Cardiovascular;  Laterality: N/A;  . CARDIAC CATHETERIZATION N/A 06/10/2015   Procedure: Coronary Stent Intervention;  Surgeon: [name], MD;  Location: [facility] INVASIVE CV LAB;  Service: Cardiovascular;  Laterality: N/A;  . CORONARY ANGIOPLASTY WITH STENT PLACEMENT  06/10/2015  . MASTECTOMY         Family History  Problem Relation Age of Onset  . Heart attack Father   . Stroke Father   . Multiple sclerosis Sister   . Healthy Mother   . Healthy Sister   . Healthy Sister     Social History   Tobacco Use  . Smoking status: Never Smoker  . Smokeless tobacco: Never Used  Substance Use Topics  . Alcohol use: Yes    Alcohol/week: 3.0 standard drinks    Types: 3 Cans of beer per week  . Drug use: No    Home Medications Prior to Admission medications   Medication Sig Start Date End Date Taking? Authorizing Provider  aspirin EC 81 MG tablet Take 81 mg by mouth daily.    [provider]  atorvastatin (LIPITOR) 40 MG tablet Take 1 tablet (40 mg total) by mouth daily. 01/15/20   Dyann Kief, PA-C  Calcium Carb-Cholecalciferol (CALCIUM-VITAMIN D) 600-400 MG-UNIT TABS Take 1 tablet by mouth daily.    [provider]  cetirizine (ZYRTEC) 10 MG tablet Take 10 mg by mouth daily as needed for allergies.     [provider]  clopidogrel (PLAVIX) 75 MG tablet Take 1  tablet (75 mg total) by mouth daily. 10/29/19   [name], MD  esomeprazole (NEXIUM) 20 MG capsule Take 1 capsule (20 mg total) by mouth daily at 12 noon. 09/18/18   [name], MD  losartan (COZAAR) 100 MG tablet Take 100 mg by mouth daily. 05/08/15   [provider]  nitroGLYCERIN (NITROSTAT) 0.4 MG SL tablet Place 1 tablet (0.4 mg total) under the tongue every 5 (five) minutes as needed for chest pain. 07/14/16   [name], MD    Allergies    Patient has no known allergies.  Review of Systems   Review of Systems    Constitutional: Negative for activity change, chills, diaphoresis and fever.  HENT: Negative.   Respiratory: Negative.  Negative for chest tightness and shortness of breath.   Cardiovascular: Positive for palpitations. Negative for chest pain and leg swelling.  Gastrointestinal: Negative.  Negative for nausea.  Musculoskeletal: Negative.   Skin: Negative.   Neurological: Negative.     Physical Exam Updated Vital Signs BP 116/82 (BP Location: Left Arm)   Pulse (!) 156   Temp 98 F (36.7 C) (Oral)   Resp 18   Ht 6' (1.829 m)   Wt 100 kg   SpO2 100%   BMI 29.90 kg/m   Physical Exam Vitals and nursing note reviewed.  Constitutional:      Appearance: He is well-developed.  HENT:     Head: Normocephalic.  Neck:     Vascular: No carotid bruit.  Cardiovascular:     Rate and Rhythm: Tachycardia present. Rhythm irregular.     Heart sounds: No murmur.  Pulmonary:     Effort: Pulmonary effort is normal.     Breath sounds: Normal breath sounds. No wheezing, rhonchi or rales.  Abdominal:     General: Bowel sounds are normal.     Palpations: Abdomen is soft.     Tenderness: There is no abdominal tenderness. There is no guarding or rebound.  Musculoskeletal:        General: Normal range of motion.     Cervical back: Normal range of motion and neck supple.     Right lower leg: No edema.     Left lower leg: No edema.  Skin:    General: Skin is warm and dry.  Neurological:     Mental Status: He is alert and oriented to person, place, and time.     ED Results / Procedures / Treatments   Labs (all labs ordered are listed, but only abnormal results are displayed) Labs Reviewed  CBC  BASIC METABOLIC PANEL  PROTIME-INR  TROPONIN I (HIGH SENSITIVITY)   Results for orders placed or performed during the hospital encounter of 02/06/20  Basic metabolic panel  Result Value Ref Range   Sodium 137 135 - 145 mmol/L   Potassium 3.6 3.5 - 5.1 mmol/L   Chloride 103 98 - 111 mmol/L    CO2 23 22 - 32 mmol/L   Glucose, Bld 129 (H) 70 - 99 mg/dL   BUN 14 6 - 20 mg/dL   Creatinine, Ser 4.43 (H) 0.61 - 1.24 mg/dL   Calcium 9.5 8.9 - 15.4 mg/dL   GFR calc non Af Amer >60 >60 mL/min   GFR calc Af Amer >60 >60 mL/min   Anion gap 11 5 - 15  CBC  Result Value Ref Range   WBC 6.7 4.0 - 10.5 K/uL   RBC 4.89 4.22 - 5.81 MIL/uL   Hemoglobin 14.5 13.0 -  17.0 g/dL   HCT 43.3 39.0 - 52.0 %   MCV 88.5 80.0 - 100.0 fL   MCH 29.7 26.0 - 34.0 pg   MCHC 33.5 30.0 - 36.0 g/dL   RDW 12.2 11.5 - 15.5 %   Platelets 238 150 - 400 K/uL   nRBC 0.0 0.0 - 0.2 %  Protime-INR (order if Patient is taking Coumadin / Warfarin)  Result Value Ref Range   Prothrombin Time 13.0 11.4 - 15.2 seconds   INR 1.0 0.8 - 1.2  Troponin I (High Sensitivity)  Result Value Ref Range   Troponin I (High Sensitivity) 3 <18 ng/L    EKG EKG Interpretation  Date/Time:  Wednesday February 06 2020 20:02:03 EST Ventricular Rate:  166 PR Interval:    QRS Duration: 76 QT Interval:  300 QTC Calculation: 498 R Axis:   44 Text Interpretation: Atrial fibrillation with rapid ventricular response with premature ventricular or aberrantly conducted complexes Marked ST abnormality, possible inferior subendocardial injury Abnormal ECG Confirmed by Quintella Reichert [phone]) on 02/06/2020 8:33:03 PM   Radiology No results found.  Procedures Procedures (including critical care time) CRITICAL CARE Performed by: Dewaine Oats   Total critical care time: 35 minutes  Critical care time was exclusive of separately billable procedures and treating other patients.  Critical care was necessary to treat or prevent imminent or life-threatening deterioration.  Critical care was time spent personally by me on the following activities: development of treatment plan with patient and/or surrogate as well as nursing, discussions with consultants, evaluation of patient's response to treatment, examination of patient, obtaining history  from patient or surrogate, ordering and performing treatments and interventions, ordering and review of laboratory studies, ordering and review of radiographic studies, pulse oximetry and re-evaluation of patient's condition.  Medications Ordered in ED Medications  sodium chloride flush (NS) 0.9 % injection 3 mL (has no administration in time range)  diltiazem (CARDIZEM) 1 mg/mL load via infusion 10 mg (has no administration in time range)    And  diltiazem (CARDIZEM) 125 mg in dextrose 5% 125 mL (1 mg/mL) infusion (has no administration in time range)    ED Course  I have reviewed the triage vital signs and the nursing notes.  Pertinent labs & imaging results that were available during my care of the patient were reviewed by me and considered in my medical decision making (see chart for details).    MDM Rules/Calculators/A&P                      Patient to ED with fast-rate palpitations starting around 7:00 pm and continuing after 2 hours prompting ED visit.   He appears very comfortable. Rate fluctuating between 130's up to 180's. Appears to be atrial fibrillation per Dr. Ralene Bathe, who has seen the patient. CHA2DS2-VASc score 2. Cardizem ordered. Labs pending. BP stable. Will continue to monitor.   9:40 - patient has converted to NSR, rate 87. Cardiology has been paged for consultation  10:15 - discussed patient's condition with cardiology, Dr. Hassell Done, who advised patient can go home since he converted to NSR and has cardiology follow up in place. Xarelto 20 mg started in ED as per pharmacy recommendation. Pharmacy to provide consultation to the patient prior to discharge. Will continue Plavix. Discussed discharge home with the patient who is comfortable with plan of discharge. .   Final Clinical Impression(s) / ED Diagnoses Final diagnoses:  None   1. Atrial fibrillation  Rx / DC Orders ED  Discharge Orders    None       Danne Harbor 02/06/20 2225    Tilden Fossa,  MD 02/08/20 1736    Tilden Fossa, MD 02/08/20 1737

## 2020-02-11 ENCOUNTER — Telehealth: Payer: Self-pay | Admitting: Interventional Cardiology

## 2020-02-11 NOTE — Telephone Encounter (Signed)
New Message:   Pt would like a call, concerning his EKG results in My-Chart please.

## 2020-02-12 NOTE — Telephone Encounter (Signed)
Called and spoke to the patient. Let him know that I had Jacolyn Reedy, PA review his last few EKGs and there has not been any change except for the Afib. Patient verbalized understanding and will keep his appointment with Dr. Ladona Ridgel tomorrow.

## 2020-02-12 NOTE — Telephone Encounter (Signed)
Called and spoke to patient. See phone note on 3/15.

## 2020-02-13 ENCOUNTER — Ambulatory Visit: Payer: Managed Care, Other (non HMO) | Admitting: Internal Medicine

## 2020-02-13 ENCOUNTER — Other Ambulatory Visit: Payer: Self-pay

## 2020-02-13 DIAGNOSIS — R002 Palpitations: Secondary | ICD-10-CM | POA: Diagnosis not present

## 2020-02-13 MED ORDER — MULTAQ 400 MG PO TABS: 400.0000 mg | ORAL_TABLET | Freq: Two times a day (BID) | ORAL | 11 refills | Status: DC

## 2020-02-13 NOTE — Progress Notes (Signed)
HPI [name] is referred by Dr. Irish Lack for evaluation and treatment of atrial fib. He is a pleasant 54 yo man with a premature h/o CAD, s/p PCI stent in 2016. He started to develop atrial fib over the past couple of years but over the last 6 months his symptoms have worsened and he has been to the ED on a couple of occaisions with rapid atrial fib. He has an Apple watch which also demonstrated his atrial fib as well. The patient feels palpitations and chest pressure with his atrial fib. He notes that ETOH and heavy eating will often trigger. He note that his wife tells him he snores. He has gained weight. His statin has been increasd and he thinks that his atrial fib has increased with the statin increase. No Known Allergies   Current Outpatient Medications  Medication Sig Dispense Refill  . aspirin EC 81 MG tablet Take 81 mg by mouth daily.    Marland Kitchen atorvastatin (LIPITOR) 40 MG tablet Take 1 tablet (40 mg total) by mouth daily. 90 tablet 3  . Calcium Carb-Cholecalciferol (CALCIUM-VITAMIN D) 600-400 MG-UNIT TABS Take 1 tablet by mouth daily.    . cetirizine (ZYRTEC) 10 MG tablet Take 10 mg by mouth daily as needed for allergies.     Marland Kitchen clopidogrel (PLAVIX) 75 MG tablet Take 1 tablet (75 mg total) by mouth daily. 90 tablet 3  . esomeprazole (NEXIUM) 20 MG capsule Take 1 capsule (20 mg total) by mouth daily at 12 noon.    Marland Kitchen losartan (COZAAR) 100 MG tablet Take 100 mg by mouth daily.  5  . metoprolol succinate (TOPROL-XL) 25 MG 24 hr tablet Take 1 tablet (25 mg total) by mouth 2 (two) times daily. 15 tablet 0  . nitroGLYCERIN (NITROSTAT) 0.4 MG SL tablet Place 1 tablet (0.4 mg total) under the tongue every 5 (five) minutes as needed for chest pain. 25 tablet 3  . rivaroxaban (XARELTO) 20 MG TABS tablet Take 1 tablet (20 mg total) by mouth daily with supper. 30 tablet 0   No current facility-administered medications for this visit.     Past Medical History:  Diagnosis Date  . Anginal  pain ([place])   . Coronary artery disease   . Dyslipidemia   . GERD (gastroesophageal reflux disease)   . Hypertension     ROS:   All systems reviewed and negative except as noted in the HPI.   Past Surgical History:  Procedure Laterality Date  . ANTERIOR CRUCIATE LIGAMENT REPAIR Left 1996  . CARDIAC CATHETERIZATION N/A 06/10/2015   Procedure: Left Heart Cath and Coronary Angiography;  Surgeon: Jettie Booze, MD;  Location: [place] CV LAB;  Service: Cardiovascular;  Laterality: N/A;  . CARDIAC CATHETERIZATION N/A 06/10/2015   Procedure: Coronary Stent Intervention;  Surgeon: Jettie Booze, MD;  Location: [place] CV LAB;  Service: Cardiovascular;  Laterality: N/A;  . CORONARY ANGIOPLASTY WITH STENT PLACEMENT  06/10/2015  . MASTECTOMY       Family History  Problem Relation Age of Onset  . Heart attack Father   . Stroke Father   . Multiple sclerosis Sister   . Healthy Mother   . Healthy Sister   . Healthy Sister      Social History   Socioeconomic History  . Marital status: Married    Spouse name: Not on file  . Number of children: Not on file  . Years of education: Not on file  . Highest education level:  Not on file  Occupational History  . Not on file  Tobacco Use  . Smoking status: Never Smoker  . Smokeless tobacco: Never Used  Substance and Sexual Activity  . Alcohol use: Yes    Alcohol/week: 3.0 standard drinks    Types: 3 Cans of beer per week  . Drug use: No  . Sexual activity: Yes  Other Topics Concern  . Not on file  Social History Narrative  . Not on file   Social Determinants of Health   Financial Resource Strain:   . Difficulty of Paying Living Expenses:   Food Insecurity:   . Worried About Programme researcher, broadcasting/film/video in the Last Year:   . Barista in the Last Year:   Transportation Needs:   . Freight forwarder (Medical):   Marland Kitchen Lack of Transportation (Non-Medical):   Physical Activity:   . Days of Exercise per Week:   .  Minutes of Exercise per Session:   Stress:   . Feeling of Stress :   Social Connections:   . Frequency of Communication with Friends and Family:   . Frequency of Social Gatherings with Friends and Family:   . Attends Religious Services:   . Active Member of Clubs or Organizations:   . Attends Banker Meetings:   Marland Kitchen Marital Status:   Intimate Partner Violence:   . Fear of Current or Ex-Partner:   . Emotionally Abused:   Marland Kitchen Physically Abused:   . Sexually Abused:      BP 122/78   Pulse 73   Ht 6' (1.829 m)   Wt 217 lb (98.4 kg)   SpO2 99%   BMI 29.43 kg/m   Physical Exam:  Well appearing NAD HEENT: Unremarkable Neck:  No JVD, no thyromegally Lymphatics:  No adenopathy Back:  No CVA tenderness Lungs:  Clear with no wheezes HEART:  Regular rate rhythm, no murmurs, no rubs, no clicks Abd:  soft, positive bowel sounds, no organomegally, no rebound, no guarding Ext:  2 plus pulses, no edema, no cyanosis, no clubbing Skin:  No rashes no nodules Neuro:  CN II through XII intact, motor grossly intact  ECG - reviewed. Atrial fib with a RVR  Assess/Plan: 1. PAF - I discussed the treatment options in detail. Adding a short acting beta blocker, starting multaq, or catheter ablation were all options. He wants to reduce the amount of his atrial fib. We will start multaq. If he fails multaq, then catheter ablation would be a strong consideration. 2. CAD - he denies anginal symptoms. He is on ASA, Plavix and Xarelto. I will defer the decision about stopping the plavix, though he is almost 5 years out and I am not sure there is much data to support continuing the plavix but I will defer to Dr. Eldridge Dace.  3. Snoring - I encouraged him to undergo a sleep study. If he has sleep apnea this will need to be treated.   Leonia Reeves.D.

## 2020-02-13 NOTE — Patient Instructions (Addendum)
Medication Instructions:   Your physician has recommended you make the following change in your medication:   START taking Multaq 400 mg-  One tablet by mouth twice a day  Come to [facility] office on February 27, 2020 at 12:30 pm for an EKG  Labwork: None ordered.  Testing/Procedures: None ordered.  Follow-Up: Your physician wants you to follow-up in: 4 months with Dr. Ladona Ridgel.   You will receive a reminder letter in the mail two months in advance. If you don't receive a letter, please call our office to schedule the follow-up appointment.  Any Other Special Instructions Will Be Listed Below (If Applicable).  If you need a refill on your cardiac medications before your next appointment, please call your pharmacy.   Dronedarone tablets What is this medicine? DRONEDARONE (droe NE da rone) is an antiarrhythmic drug. It helps make your heart beat regularly. This medicine may be used for other purposes; ask your health care provider or pharmacist if you have questions. COMMON BRAND NAME(S): Multaq What should I tell my health care provider before I take this medicine? They need to know if you have any of these conditions:  heart failure  history of irregular heartbeat  liver disease  liver or lung problems with the past use of amiodarone  low levels of magnesium in the blood  low levels of potassium in the blood  other heart disease  an unusual or allergic reaction to dronedarone, other medicines, foods, dyes, or preservatives  pregnant or trying to get pregnant  breast-feeding How should I use this medicine? Take this medicine by mouth with a glass of water. Follow the directions on the prescription label. Take one tablet with the morning meal and one tablet with the evening meal. Do not take your medicine more often than directed. Do not stop taking except on the advice of your doctor or health care professional. A special MedGuide will be given to you by the pharmacist  with each prescription and refill. Be sure to read this information carefully each time. Talk to your pediatrician regarding the use of this medicine in children. Special care may be needed. Overdosage: If you think you have taken too much of this medicine contact a poison control center or emergency room at once. NOTE: This medicine is only for you. Do not share this medicine with others. What if I miss a dose? If you miss a dose, take it as soon as you can. If it is almost time for your next dose, take only that dose. Do not take double or extra doses. What may interact with this medicine? Do not take this medicine with any of the following medications:  arsenic trioxide  certain antibiotics like clarithromycin, erythromycin, pentamidine, telithromycin, troleandomycin  certain medicines for depression like tricyclic antidepressants  certain medicines for fungal infections like fluconazole, itraconazole, ketoconazole, posaconazole, voriconazole  certain medicines for irregular heart beat like amiodarone, disopyramide, flecainide, ibutilide, quinidine, propafenone, sotalol  certain medicines for malaria like chloroquine, halofantrine  cisapride  cyclosporine  droperidol  haloperidol  methadone  other medicines that prolong the QT interval (cause an abnormal heart rhythm)  pimozide  nefazodone  phenothiazines like chlorpromazine, mesoridazine, prochlorperazine, thioridazine  ritonavir  ziprasidone This medicine may also interact with the following medications:  certain medicines for blood pressure, heart disease, or irregular heart beat like diltiazem, metoprolol, propranolol, verapamil  certain medicines for cholesterol like atorvastatin, lovastatin, simvastatin  certain medicines for seizures like carbamazepine, phenobarbital, phenytoin  digoxin  dofetilide  grapefruit juice  rifampin  sirolimus  St. John's Wort  tacrolimus This list may not describe  all possible interactions. Give your health care provider a list of all the medicines, herbs, non-prescription drugs, or dietary supplements you use. Also tell them if you smoke, drink alcohol, or use illegal drugs. Some items may interact with your medicine. What should I watch for while using this medicine? Your condition will be monitored closely when you first begin therapy. Often, this drug is first started in a hospital or other monitored health care setting. Once you are on maintenance therapy, visit your doctor or health care professional for regular checks on your progress. Because your condition and use of this medicine carry some risk, it is a good idea to carry an identification card, necklace or bracelet with details of your condition, medications, and doctor or health care professional. Dennis Bast may get drowsy or dizzy. Do not drive, use machinery, or do anything that needs mental alertness until you know how this medicine affects you. Do not stand or sit up quickly, especially if you are an older patient. This reduces the risk of dizzy or fainting spells. What side effects may I notice from receiving this medicine? Side effects that you should report to your doctor or health care professional as soon as possible:  allergic reactions like skin rash, itching or hives, swelling of the face, lips, or tongue  breathing problems  cough  dark urine  fast, irregular heartbeat  general ill feeling or flu-like symptoms  light-colored stools  loss of appetite, nausea  right upper belly pain  slow heartbeat  stomach pain  swelling of the legs or ankles  unusually weak or tired  weight gain  yellowing of the eyes or skin Side effects that usually do not require medical attention (report to your doctor or health care professional if they continue or are bothersome):  nausea  vomiting  stomach pain This list may not describe all possible side effects. Call your doctor for  medical advice about side effects. You may report side effects to FDA at 1-800-FDA-1088. Where should I keep my medicine? Keep out of the reach of children. Store at room temperature between 15 and 30 degrees C (59 and 86 degrees F). Throw away any unused medicine after the expiration date. NOTE: This sheet is a summary. It may not cover all possible information. If you have questions about this medicine, talk to your doctor, pharmacist, or health care provider.  2020 Elsevier/Gold Standard (2018-11-06 10:43:10)

## 2020-02-15 ENCOUNTER — Telehealth: Payer: Self-pay | Admitting: Internal Medicine

## 2020-02-15 NOTE — Telephone Encounter (Signed)
Returned call to patient who states he went into afib this afternoon after getting home from work. Reports HR is 107-110 bpm so not as high as it was when he went to the ED. He denies that he feels poor.  He states he is not taking metoprolol succinate so I have removed it from his list. He admits he had a busy day at work and only had one cup of water. I advised him to hydrate well, limit alcohol and caffeine and to give the Multaq more time to be effective. He asks about activity and I advised that physical activity is not prohibited but to monitor for signs and symptoms of dizziness, syncope, chest discomfort, or rapid HR. I advised I will route message to Dr. Ladona Ridgel and his RN and that they will call to follow-up next week. Patient verbalized understanding and agreement and thanked me for the call.

## 2020-02-15 NOTE — Telephone Encounter (Signed)
Pt c/o medication issue:  1. Name of Medication: dronedarone (MULTAQ) 400 MG tablet  2. How are you currently taking this medication (dosage and times per day)? As directed   3. Are you having a reaction (difficulty breathing--STAT)? no  4. What is your medication issue? Pt is still in afib. He has only taken the multaq for two days, but he is curious how long it will take to regulate his hr. He meant to ask at his appointment on Wednesday but forgot  Dr. Ladona Ridgel mentioned the possibility of a fast acting metoprolol, and if that would interact with the multaq

## 2020-02-18 NOTE — Telephone Encounter (Signed)
Marcelino Duster, I agree with your assessment and plan.

## 2020-02-27 ENCOUNTER — Other Ambulatory Visit: Payer: Self-pay

## 2020-02-27 ENCOUNTER — Ambulatory Visit: Payer: Managed Care, Other (non HMO) | Attending: Internal Medicine

## 2020-02-27 ENCOUNTER — Ambulatory Visit (INDEPENDENT_AMBULATORY_CARE_PROVIDER_SITE_OTHER): Payer: Managed Care, Other (non HMO)

## 2020-02-27 ENCOUNTER — Telehealth: Payer: Self-pay

## 2020-02-27 DIAGNOSIS — I480 Paroxysmal atrial fibrillation: Secondary | ICD-10-CM

## 2020-02-27 DIAGNOSIS — Z23 Encounter for immunization: Secondary | ICD-10-CM

## 2020-02-27 MED ORDER — METOPROLOL TARTRATE 50 MG PO TABS: ORAL_TABLET | ORAL | 11 refills | Status: DC

## 2020-02-27 NOTE — Progress Notes (Signed)
   Covid-19 Vaccination Clinic  Name:  Marvin Soto    MRN: [id number] DOB: [birth year]  02/27/2020  [name] was observed post Covid-19 immunization for 15 minutes without incident. He was provided with Vaccine Information Sheet and instruction to access the V-Safe system.   [name] was instructed to call 911 with any severe reactions post vaccine: Marland Kitchen Difficulty breathing  . Swelling of face and throat  . A fast heartbeat  . A bad rash all over body  . Dizziness and weakness   Immunizations Administered    Name Date Dose VIS Date Route   Pfizer COVID-19 Vaccine 02/27/2020  1:54 PM 0.3 mL 11/09/2019 Intramuscular   Manufacturer: [employer], [employer]   Lot: [id number]   NDC: [address]-6

## 2020-02-27 NOTE — Telephone Encounter (Signed)
LDL was 91 in 3/21. COntinue atorvastatin.  Will recheck lipids in 9/21 and reassess.  Would not want to make too many med changes at the same time regardless, so continue atorvastatin.  JV

## 2020-02-27 NOTE — Patient Instructions (Signed)
Medication Instructions:  Your physician recommends that you continue on your current medications as directed. Please refer to the Current Medication list given to you today.  Labwork: None ordered.  Testing/Procedures: None ordered.  Follow-Up: Your physician wants you to follow-up in:   May 06, 2020 at 9;45 am with Dr. Ladona Ridgel.    Any Other Special Instructions Will Be Listed Below (If Applicable).  If you need a refill on your cardiac medications before your next appointment, please call your pharmacy.

## 2020-02-27 NOTE — Telephone Encounter (Signed)
Pt came in for nurse visit after starting Multaq.  Pt has a few questions:  1.  He is wondering if he should continue on atorvastatin.  He's not sure it's working and he wonders if he should be put on a different statin?  Will send this question to Dr. Eldridge Dace for review  2.  Asked if he should be on Plavix and Eliquis-per Dr. Ladona Ridgel have Pt stop Plavix and start ASA 81 mg daily.  3.  Asked if he should have an Echo.  Per Dr. Gwenyth Bouillon another month and then get Echo.  4.  What to take for breakthrough afib?  Per Dr. Ladona Ridgel metoprolol tartrate 50 mg one tablet PO as needed for breakthrough afib

## 2020-02-27 NOTE — Progress Notes (Signed)
Reason for visit: new start Multaq for afib  Name of MD requesting visit: Lewayne Bunting, MD  H&P: Pt with paroxysmal atrial fibrillation.  Started Multaq 400 mg PO BID  ROS related to problem: Pt started Multaq 2 weeks ago.  States at first he had some GI upset but that has resolved.  Had some breakthrough afib the first week he was on Multaq, but the last week has not had any breakthrough afib.  Feels like he is tolerating medication OK at this point.  Assessment and plan per MD: Per Dr. Richardine Service Pt continue on multaq.

## 2020-03-10 ENCOUNTER — Other Ambulatory Visit: Payer: Self-pay

## 2020-03-10 ENCOUNTER — Telehealth: Payer: Self-pay

## 2020-03-10 MED ORDER — RIVAROXABAN 20 MG PO TABS: 20.0000 mg | ORAL_TABLET | Freq: Every day | ORAL | 5 refills | Status: DC

## 2020-03-10 NOTE — Telephone Encounter (Signed)
Call received from Pt for refill of Xarelto

## 2020-03-10 NOTE — Telephone Encounter (Signed)
Pt last saw Dr Ladona Ridgel 02/13/20, last labs 02/06/20 Creat 1.25, age 54, weight 98.4kg, CrCl 94.03, based on CrCl pt is on appropriate dosage of Xarelto 20mg  QD.  Will refill rx.

## 2020-03-11 ENCOUNTER — Telehealth: Payer: Self-pay

## 2020-03-11 NOTE — Telephone Encounter (Signed)
**Note De-Identified Jadesola Poynter Obfuscation** I have started a Xarelto PA through covermymeds. Key: Ulis Rias Key: Armanda Heritage - PA Case ID: [id number] Outcome Approved today Case ID:[id number] Status:Approved;Review Type:Prior Auth; Coverage Start Date:03/11/2020;Coverage End Date:03/11/2021 Drug Xarelto 20MG  tablets Form [employer] Electronic PA Form (2017 NCPDP)  I have notified the pt and his pharmacy of this approval.

## 2020-03-18 ENCOUNTER — Other Ambulatory Visit: Payer: Managed Care, Other (non HMO)

## 2020-04-16 ENCOUNTER — Other Ambulatory Visit: Payer: Self-pay

## 2020-04-16 ENCOUNTER — Ambulatory Visit (HOSPITAL_COMMUNITY): Payer: Managed Care, Other (non HMO) | Attending: Cardiovascular Disease

## 2020-04-16 DIAGNOSIS — I480 Paroxysmal atrial fibrillation: Secondary | ICD-10-CM | POA: Diagnosis present

## 2020-05-06 ENCOUNTER — Ambulatory Visit: Payer: Managed Care, Other (non HMO) | Admitting: Internal Medicine

## 2020-05-06 ENCOUNTER — Encounter: Payer: Self-pay | Admitting: Internal Medicine

## 2020-05-06 ENCOUNTER — Other Ambulatory Visit: Payer: Self-pay

## 2020-05-06 VITALS — BP 128/80 | HR 66 | Ht 72.0 in | Wt 216.6 lb

## 2020-05-06 DIAGNOSIS — I480 Paroxysmal atrial fibrillation: Secondary | ICD-10-CM | POA: Diagnosis not present

## 2020-05-06 NOTE — Patient Instructions (Addendum)

## 2020-05-06 NOTE — Progress Notes (Signed)
HPI Marvin Soto returns today for followup. He is a pleasant 54 yo man with a h/o PAF, CAD, and was started on multaq. In the interim he has done well except for getting sleepy in the evening. He has occaisional brief episodes of palpitations lasting seconds to minutes. He thinks that his snoring is less.  No Known Allergies   Current Outpatient Medications  Medication Sig Dispense Refill  . aspirin EC 81 MG tablet Take 81 mg by mouth daily.    Marland Kitchen atorvastatin (LIPITOR) 40 MG tablet Take 1 tablet (40 mg total) by mouth daily. 90 tablet 3  . Calcium Carb-Cholecalciferol (CALCIUM-VITAMIN D) 600-400 MG-UNIT TABS Take 1 tablet by mouth daily.    . cetirizine (ZYRTEC) 10 MG tablet Take 10 mg by mouth daily as needed for allergies.     Marland Kitchen dronedarone (MULTAQ) 400 MG tablet Take 1 tablet (400 mg total) by mouth 2 (two) times daily with a meal. 60 tablet 11  . esomeprazole (NEXIUM) 20 MG capsule Take 1 capsule (20 mg total) by mouth daily at 12 noon.    Marland Kitchen losartan (COZAAR) 100 MG tablet Take 100 mg by mouth daily.  5  . metoprolol tartrate (LOPRESSOR) 50 MG tablet Take one tablet by mouth as needed for breakthrough afib.  You may take 2 tablets within 24 hours. 60 tablet 11  . nitroGLYCERIN (NITROSTAT) 0.4 MG SL tablet Place 1 tablet (0.4 mg total) under the tongue every 5 (five) minutes as needed for chest pain. 25 tablet 3  . rivaroxaban (XARELTO) 20 MG TABS tablet Take 1 tablet (20 mg total) by mouth daily with supper. 30 tablet 5   No current facility-administered medications for this visit.     Past Medical History:  Diagnosis Date  . Anginal pain ([place])   . Coronary artery disease   . Dyslipidemia   . GERD (gastroesophageal reflux disease)   . Hypertension     ROS:   All systems reviewed and negative except as noted in the HPI.   Past Surgical History:  Procedure Laterality Date  . ANTERIOR CRUCIATE LIGAMENT REPAIR Left 1996  . CARDIAC CATHETERIZATION N/A 06/10/2015   Procedure: Left Heart Cath and Coronary Angiography;  Surgeon: Jettie Booze, MD;  Location: [place] [facility];  Service: Cardiovascular;  Laterality: N/A;  . CARDIAC CATHETERIZATION N/A 06/10/2015   Procedure: Coronary Stent Intervention;  Surgeon: Jettie Booze, MD;  Location: [place] [facility];  Service: Cardiovascular;  Laterality: N/A;  . CORONARY ANGIOPLASTY WITH STENT PLACEMENT  06/10/2015  . MASTECTOMY       Family History  Problem Relation Age of Onset  . Heart attack Father   . Stroke Father   . Multiple sclerosis Sister   . Healthy Mother   . Healthy Sister   . Healthy Sister      Social History   Socioeconomic History  . Marital status: Married    Spouse name: Not on file  . Number of children: Not on file  . Years of education: Not on file  . Highest education level: Not on file  Occupational History  . Not on file  Tobacco Use  . Smoking status: Never Smoker  . Smokeless tobacco: Never Used  Substance and Sexual Activity  . Alcohol use: Yes    Alcohol/week: 3.0 standard drinks    Types: 3 Cans of beer per week  . Drug use: No  . Sexual activity: Yes  Other Topics Concern  .  Not on file  Social History Narrative  . Not on file   Social Determinants of Health   Financial Resource Strain:   . Difficulty of Paying Living Expenses:   Food Insecurity:   . Worried About Programme researcher, broadcasting/film/video in the Last Year:   . Barista in the Last Year:   Transportation Needs:   . Freight forwarder (Medical):   Marland Kitchen Lack of Transportation (Non-Medical):   Physical Activity:   . Days of Exercise per Week:   . Minutes of Exercise per Session:   Stress:   . Feeling of Stress :   Social Connections:   . Frequency of Communication with Friends and Family:   . Frequency of Social Gatherings with Friends and Family:   . Attends Religious Services:   . Active Member of Clubs or Organizations:   . Attends Banker Meetings:   Marland Kitchen Marital  Status:   Intimate Partner Violence:   . Fear of Current or Ex-Partner:   . Emotionally Abused:   Marland Kitchen Physically Abused:   . Sexually Abused:      BP 128/80   Pulse 66   Ht 6' (1.829 m)   Wt 216 lb 9.6 oz (98.2 kg)   SpO2 99%   BMI 29.38 kg/m   Physical Exam:  Well appearing NAD HEENT: Unremarkable Neck:  No JVD, no thyromegally Lymphatics:  No adenopathy Back:  No CVA tenderness Lungs:  Clear with no wheezes HEART:  Regular rate rhythm, no murmurs, no rubs, no clicks Abd:  soft, positive bowel sounds, no organomegally, no rebound, no guarding Ext:  2 plus pulses, no edema, no cyanosis, no clubbing Skin:  No rashes no nodules Neuro:  CN II through XII intact, motor grossly intact  EKG - nsr  Assess/Plan: 1. PAF - he is much improved. He will continue multaq and a beta blocker. 2. CAD - he denies anginal symptoms.  3. Coags - he has had no bleeding on xarelto. I reminded him that he needs to have food on his stomach 4. HTN - his bp is controlled today. We will follow. 5. Dyslipidemia - continue statin therapy.  Leonia Reeves.D.

## 2020-08-12 MED ORDER — LOSARTAN POTASSIUM 100 MG PO TABS: 100.0000 mg | ORAL_TABLET | Freq: Every day | ORAL | 3 refills | Status: DC

## 2020-09-08 ENCOUNTER — Other Ambulatory Visit: Payer: Self-pay | Admitting: Internal Medicine

## 2020-09-08 DIAGNOSIS — I480 Paroxysmal atrial fibrillation: Secondary | ICD-10-CM

## 2020-09-08 NOTE — Telephone Encounter (Signed)
Prescription refill request for Xarelto received.  Indication: PAF Last office visit: 05/06/20 Weight: 98.2kg Age: 54 Scr:  1.25 CrCl: 94 mL/min

## 2020-12-11 ENCOUNTER — Other Ambulatory Visit: Payer: Self-pay

## 2020-12-11 ENCOUNTER — Encounter: Payer: Self-pay | Admitting: Cardiology

## 2020-12-11 ENCOUNTER — Ambulatory Visit: Payer: Managed Care, Other (non HMO) | Admitting: Cardiology

## 2020-12-11 VITALS — BP 132/86 | HR 66 | Ht 72.0 in | Wt 227.6 lb

## 2020-12-11 DIAGNOSIS — I480 Paroxysmal atrial fibrillation: Secondary | ICD-10-CM

## 2020-12-11 DIAGNOSIS — E785 Hyperlipidemia, unspecified: Secondary | ICD-10-CM

## 2020-12-11 DIAGNOSIS — I10 Essential (primary) hypertension: Secondary | ICD-10-CM | POA: Diagnosis not present

## 2020-12-11 NOTE — Patient Instructions (Addendum)
Medication Instructions:  Your physician recommends that you continue on your current medications as directed. Please refer to the Current Medication list given to you today.  Labwork: None ordered.  Testing/Procedures: None ordered.  Follow-Up: Your physician wants you to follow-up in: as needed with Dr. Lambert.    Any Other Special Instructions Will Be Listed Below (If Applicable).  If you need a refill on your cardiac medications before your next appointment, please call your pharmacy.   

## 2020-12-11 NOTE — Progress Notes (Signed)
Electrophysiology Office Note:    Date:  12/11/2020   ID:  [name], DOB 1966-02-18, MRN [id number]  PCP:  [name], MD  [facility] Cardiologist:  Lance Muss, MD  [facility] Electrophysiologist:  Lewayne Bunting, MD  Referring MD: [name], MD   Chief Complaint: Paroxysmal atrial fibrillation  History of Present Illness:    Marvin Soto is a 55 y.o. male who presents for an evaluation of paroxysmal atrial fibrillation at the request of Dr. Ladona Ridgel. Their medical history includes coronary artery disease, hypertension, GERD, dyslipidemia.  He last saw Dr. Ladona Ridgel on May 06, 2020.  For his atrial fibrillation he is previously taken dronedarone and beta-blockers.  He is on Xarelto for stroke prophylaxis.  During my appointment with the patient today, he tells me that for several months after starting treatment around his atrial fibrillation episodes were well controlled.  He then started to have brief burst of atrial fibrillation and is now starting to have them more frequently and they are lasting longer.  They are continuing to be symptomatic.  No syncope or presyncope.  No chest pain or shortness of breath.  Past Medical History:  Diagnosis Date  . Anginal pain ([facility])   . Coronary artery disease   . Dyslipidemia   . GERD (gastroesophageal reflux disease)   . Hypertension     Past Surgical History:  Procedure Laterality Date  . ANTERIOR CRUCIATE LIGAMENT REPAIR Left 1996  . CARDIAC CATHETERIZATION N/A 06/10/2015   Procedure: Left Heart Cath and Coronary Angiography;  Surgeon: [name], MD;  Location: [facility] IN[facility];  Service: Cardiovascular;  Laterality: N/A;  . CARDIAC CATHETERIZATION N/A 06/10/2015   Procedure: Coronary Stent Intervention;  Surgeon: [name], MD;  Location: [facility] INVASIVE CV LAB;  Service: Cardiovascular;  Laterality: N/A;  . CORONARY ANGIOPLASTY WITH STENT PLACEMENT  06/10/2015  . MASTECTOMY      Current  Medications: Current Meds  Medication Sig  . aspirin EC 81 MG tablet Take 81 mg by mouth daily.  Marland Kitchen atorvastatin (LIPITOR) 40 MG tablet Take 1 tablet (40 mg total) by mouth daily.  . cetirizine (ZYRTEC) 10 MG tablet Take 10 mg by mouth daily as needed for allergies.   Marland Kitchen dronedarone (MULTAQ) 400 MG tablet Take 1 tablet (400 mg total) by mouth 2 (two) times daily with a meal.  . esomeprazole (NEXIUM) 20 MG capsule Take 1 capsule (20 mg total) by mouth daily at 12 noon.  Marland Kitchen losartan (COZAAR) 100 MG tablet Take 1 tablet (100 mg total) by mouth daily.  . metoprolol tartrate (LOPRESSOR) 50 MG tablet Take one tablet by mouth as needed for breakthrough afib.  You may take 2 tablets within 24 hours.  . nitroGLYCERIN (NITROSTAT) 0.4 MG SL tablet Place 1 tablet (0.4 mg total) under the tongue every 5 (five) minutes as needed for chest pain.  Marland Kitchen XARELTO 20 MG TABS tablet TAKE 1 TABLET(20 MG) BY MOUTH DAILY WITH SUPPER     Allergies:   Patient has no known allergies.   Social History   Socioeconomic History  . Marital status: Married    Spouse name: Not on file  . Number of children: Not on file  . Years of education: Not on file  . Highest education level: Not on file  Occupational History  . Not on file  Tobacco Use  . Smoking status: Never Smoker  . Smokeless tobacco: Never Used  Substance and Sexual Activity  . Alcohol use: Yes  Alcohol/week: 3.0 standard drinks    Types: 3 Cans of beer per week  . Drug use: No  . Sexual activity: Yes  Other Topics Concern  . Not on file  Social History Narrative  . Not on file   Social Determinants of Health   Financial Resource Strain: Not on file  Food Insecurity: Not on file  Transportation Needs: Not on file  Physical Activity: Not on file  Stress: Not on file  Social Connections: Not on file     Family History: The patient's family history includes Healthy in his mother, sister, and sister; Heart attack in his father; Multiple sclerosis  in his sister; Stroke in his father.  ROS:   Please see the history of present illness.    All other systems reviewed and are negative.  EKGs/Labs/Other Studies Reviewed:    The following studies were reviewed today:  Apr 16, 2020 echo personally reviewed Left ventricular function normal, 60% Right ventricular function normal No significant valvular abnormalities  EKG:  The ekg ordered today demonstrates sinus rhythm Recent Labs: 01/29/2020: ALT 24; TSH 1.580 02/06/2020: BUN 14; Creatinine, Ser 1.25; Hemoglobin 14.5; Platelets 238; Potassium 3.6; Sodium 137  Recent Lipid Panel    Component Value Date/Time   CHOL 151 01/29/2020 1031   TRIG 134 01/29/2020 1031   HDL 36 (L) 01/29/2020 1031   CHOLHDL 4.2 01/29/2020 1031   CHOLHDL 3.4 02/23/2016 0802   VLDL 13 02/23/2016 0802   LDLCALC 91 01/29/2020 1031    Physical Exam:    VS:  BP 132/86   Pulse 66   Ht 6' (1.829 m)   Wt 227 lb 9.6 oz (103.2 kg)   SpO2 99%   BMI 30.87 kg/m     Wt Readings from Last 3 Encounters:  12/11/20 227 lb 9.6 oz (103.2 kg)  05/06/20 216 lb 9.6 oz (98.2 kg)  02/13/20 217 lb (98.4 kg)     GEN:  Well nourished, well developed in no acute distress HEENT: Normal NECK: No JVD; No carotid bruits LYMPHATICS: No lymphadenopathy CARDIAC: RRR, no murmurs, rubs, gallops RESPIRATORY:  Clear to auscultation without rales, wheezing or rhonchi  ABDOMEN: Soft, non-tender, non-distended MUSCULOSKELETAL:  No edema; No deformity  SKIN: Warm and dry NEUROLOGIC:  Alert and oriented x 3 PSYCHIATRIC:  Normal affect   ASSESSMENT:    1. Paroxysmal atrial fibrillation (HCC)   2. Essential hypertension   3. Hyperlipidemia, unspecified hyperlipidemia type    PLAN:    In order of problems listed above:  1. Symptomatic paroxysmal atrial fibrillation Continues to have symptomatic breakthrough episodes of atrial fibrillation despite treatment with dronedarone and beta-blocker.  Discussed the treatment options  with the patient including continued management with dronedarone, changing antiarrhythmics or pursuing ablation therapy.  Antiarrhythmic therapy options are limited due to his history of coronary artery disease.  I would favor using either sotalol or dofetilide.  I do think he is a very good candidate for ablation given his overall functional status, relatively young age and paroxysmal nature of the atrial fibrillation.  We discussed ablation in detail during today's visit including the associated risks, expected recovery time and typical success rates.  He would like to think about it with his wife more before making a final decision about ablation.  Risk, benefits, and alternatives to EP study and radiofrequency ablation for afib were also discussed in detail today. These risks include but are not limited to stroke, bleeding, vascular damage, tamponade, perforation, damage to the esophagus, lungs,  and other structures, pulmonary vein stenosis, worsening renal function, and death. Carto, ICE, anesthesia would be needed for the procedure.  If we proceed with scheduling, will need a CT PV protocol prior to the procedure to exclude LAA thrombus and further evaluate atrial anatomy.  In the meantime, he will continue his Xarelto for stroke prophylaxis.   2.  Hypertension Continue the losartan and metoprolol  3.  Hyperlipidemia Continue atorvastatin   Medication Adjustments/Labs and Tests Ordered: Current medicines are reviewed at length with the patient today.  Concerns regarding medicines are outlined above.  Orders Placed This Encounter  Procedures  . EKG 12-Lead   No orders of the defined types were placed in this encounter.    Signed, Steffanie Dunn, MD, [facility]  12/11/2020 9:25 AM    Electrophysiology [facility]

## 2020-12-18 ENCOUNTER — Telehealth: Payer: Self-pay

## 2020-12-18 NOTE — Telephone Encounter (Signed)
**Note De-Identified Youcef Klas Obfuscation** Multaq PA approval as follows: [name]: [id number] - PA Case ID: [id number] - Rx #: [id number] Outcome: Approved today Prior Auth;Coverage Start Date:12/16/2020;Coverage End Date:12/18/2021 Drug: Multaq 400MG  tablets Form: PA Form [id number] NCPDP)  I have made [facility] and the pt aware of this approval.

## 2020-12-24 ENCOUNTER — Telehealth: Payer: Self-pay

## 2020-12-24 DIAGNOSIS — I480 Paroxysmal atrial fibrillation: Secondary | ICD-10-CM

## 2020-12-24 NOTE — Telephone Encounter (Signed)
Pt scheduled for afib ablation on February 16, 2021 at 7:30 am  Labs/covid test scheduled  Instruction letters sent via mychart  Work up complete.

## 2021-01-27 ENCOUNTER — Other Ambulatory Visit: Payer: Managed Care, Other (non HMO)

## 2021-01-27 ENCOUNTER — Other Ambulatory Visit: Payer: Self-pay

## 2021-01-27 DIAGNOSIS — I480 Paroxysmal atrial fibrillation: Secondary | ICD-10-CM

## 2021-01-27 LAB — CBC WITH DIFFERENTIAL/PLATELET
Basophils Absolute: 0 10*3/uL (ref 0.0–0.2)
Basos: 0 %
EOS (ABSOLUTE): 0.3 10*3/uL (ref 0.0–0.4)
Eos: 5 %
Hematocrit: 39.8 % (ref 37.5–51.0)
Hemoglobin: 13.8 g/dL (ref 13.0–17.7)
Lymphocytes Absolute: 1.5 10*3/uL (ref 0.7–3.1)
Lymphs: 27 %
MCH: 29.9 pg (ref 26.6–33.0)
MCHC: 34.7 g/dL (ref 31.5–35.7)
MCV: 86 fL (ref 79–97)
Monocytes Absolute: 0.5 10*3/uL (ref 0.1–0.9)
Monocytes: 9 %
Neutrophils Absolute: 3.3 10*3/uL (ref 1.4–7.0)
Neutrophils: 59 %
Platelets: 180 10*3/uL (ref 150–450)
RBC: 4.61 x10E6/uL (ref 4.14–5.80)
RDW: 13.8 % (ref 11.6–15.4)
WBC: 5.5 10*3/uL (ref 3.4–10.8)

## 2021-01-27 LAB — BASIC METABOLIC PANEL
BUN/Creatinine Ratio: 13 (ref 9–20)
BUN: 13 mg/dL (ref 6–24)
CO2: 27 mmol/L (ref 20–29)
Calcium: 9.5 mg/dL (ref 8.7–10.2)
Chloride: 106 mmol/L (ref 96–106)
Creatinine, Ser: 0.99 mg/dL (ref 0.76–1.27)
Glucose: 99 mg/dL (ref 65–99)
Potassium: 4.6 mmol/L (ref 3.5–5.2)
Sodium: 141 mmol/L (ref 134–144)
eGFR: 91 mL/min/{1.73_m2} (ref >59)

## 2021-02-03 ENCOUNTER — Other Ambulatory Visit: Payer: Self-pay | Admitting: Physician Assistant

## 2021-02-09 ENCOUNTER — Telehealth (HOSPITAL_COMMUNITY): Payer: Self-pay | Admitting: *Deleted

## 2021-02-09 NOTE — Telephone Encounter (Signed)
Attempted to call patient regarding upcoming cardiac CT appointment. °Left message on voicemail with name and callback number ° °Dixie Coppa RN Navigator Cardiac Imaging °[facility] °[phone] Office °[phone] Cell ° °

## 2021-02-11 ENCOUNTER — Ambulatory Visit (HOSPITAL_COMMUNITY)
Admission: RE | Admit: 2021-02-11 | Discharge: 2021-02-11 | Disposition: A | Discharge status: Home / Self Care | Payer: Managed Care, Other (non HMO) | Source: Ambulatory Visit | Attending: Cardiology | Admitting: Cardiology

## 2021-02-11 ENCOUNTER — Other Ambulatory Visit: Payer: Self-pay

## 2021-02-11 DIAGNOSIS — I480 Paroxysmal atrial fibrillation: Secondary | ICD-10-CM | POA: Insufficient documentation

## 2021-02-11 MED ORDER — METOPROLOL TARTRATE 5 MG/5ML IV SOLN
5.0000 mg | INTRAVENOUS | Status: DC | PRN
  Administered 2021-02-11: 5 mg via INTRAVENOUS

## 2021-02-11 MED ORDER — METOPROLOL TARTRATE 5 MG/5ML IV SOLN
INTRAVENOUS | Status: AC
  Filled 2021-02-11: qty 15

## 2021-02-11 MED ORDER — IOHEXOL 350 MG/ML SOLN
80.0000 mL | Freq: Once | INTRAVENOUS | Status: AC | PRN
  Administered 2021-02-11: 80 mL via INTRAVENOUS

## 2021-02-11 NOTE — Progress Notes (Signed)
   02/11/21 0805  Vital Signs  Pulse Rate 73  Pulse Rate Source Monitor  BP 99/76  MAP (mmHg) 76  BP Location Left Arm  BP Method Automatic  Patient Position (if appropriate) Sitting  MEWS Score  MEWS Temp 0  MEWS Systolic 1  MEWS Pulse 0  MEWS RR 0  MEWS LOC 0  MEWS Score 1  MEWS Score Color Green   Recheck vital signs after 5mg  IV metoprolol dose. See MAR for details.

## 2021-02-11 NOTE — Progress Notes (Signed)
   02/11/21 0843  Vital Signs  Pulse Rate 66  Pulse Rate Source Monitor  BP 110/72  MAP (mmHg) 82  BP Location Left Arm  BP Method Automatic  Patient Position (if appropriate) Sitting  MEWS Score  MEWS Temp 0  MEWS Systolic 0  MEWS Pulse 0  MEWS RR 0  MEWS LOC 0  MEWS Score 0  MEWS Score Color Green   Recheck vitals after CT scan, patient has no complaints of dizziness, or lightheadedness Discharged from CT to lobby

## 2021-02-13 ENCOUNTER — Other Ambulatory Visit: Payer: Self-pay | Admitting: Internal Medicine

## 2021-02-13 MED ORDER — MULTAQ 400 MG PO TABS: 400.0000 mg | ORAL_TABLET | Freq: Two times a day (BID) | ORAL | 3 refills | Status: DC

## 2021-02-14 ENCOUNTER — Other Ambulatory Visit (HOSPITAL_COMMUNITY)
Admission: RE | Admit: 2021-02-14 | Discharge: 2021-02-14 | Disposition: A | Discharge status: Home / Self Care | Payer: Managed Care, Other (non HMO) | Source: Ambulatory Visit | Attending: Cardiology | Admitting: Cardiology

## 2021-02-14 DIAGNOSIS — Z01812 Encounter for preprocedural laboratory examination: Secondary | ICD-10-CM | POA: Diagnosis not present

## 2021-02-14 DIAGNOSIS — Z20822 Contact with and (suspected) exposure to covid-19: Secondary | ICD-10-CM | POA: Insufficient documentation

## 2021-02-15 LAB — SARS CORONAVIRUS 2 (TAT 6-24 HRS): SARS Coronavirus 2: NEGATIVE

## 2021-02-16 ENCOUNTER — Encounter (HOSPITAL_COMMUNITY)
Admission: RE | Disposition: A | Discharge status: Home / Self Care | Payer: Self-pay | Source: Home / Self Care | Attending: Cardiology

## 2021-02-16 ENCOUNTER — Other Ambulatory Visit: Payer: Self-pay

## 2021-02-16 ENCOUNTER — Encounter (HOSPITAL_COMMUNITY): Payer: Self-pay | Admitting: Cardiology

## 2021-02-16 ENCOUNTER — Ambulatory Visit (HOSPITAL_COMMUNITY)
Admission: RE | Admit: 2021-02-16 | Discharge: 2021-02-16 | Disposition: A | Discharge status: Home / Self Care | Payer: Managed Care, Other (non HMO) | Attending: Cardiology | Admitting: Cardiology

## 2021-02-16 ENCOUNTER — Ambulatory Visit (HOSPITAL_COMMUNITY): Payer: Managed Care, Other (non HMO) | Admitting: Certified Registered Nurse Anesthetist

## 2021-02-16 DIAGNOSIS — I480 Paroxysmal atrial fibrillation: Secondary | ICD-10-CM | POA: Diagnosis not present

## 2021-02-16 DIAGNOSIS — Z7901 Long term (current) use of anticoagulants: Secondary | ICD-10-CM | POA: Diagnosis not present

## 2021-02-16 DIAGNOSIS — E785 Hyperlipidemia, unspecified: Secondary | ICD-10-CM | POA: Insufficient documentation

## 2021-02-16 DIAGNOSIS — Z79899 Other long term (current) drug therapy: Secondary | ICD-10-CM | POA: Diagnosis not present

## 2021-02-16 DIAGNOSIS — Z7982 Long term (current) use of aspirin: Secondary | ICD-10-CM | POA: Diagnosis not present

## 2021-02-16 DIAGNOSIS — I10 Essential (primary) hypertension: Secondary | ICD-10-CM | POA: Insufficient documentation

## 2021-02-16 DIAGNOSIS — Z8249 Family history of ischemic heart disease and other diseases of the circulatory system: Secondary | ICD-10-CM | POA: Insufficient documentation

## 2021-02-16 DIAGNOSIS — K219 Gastro-esophageal reflux disease without esophagitis: Secondary | ICD-10-CM | POA: Insufficient documentation

## 2021-02-16 DIAGNOSIS — I2510 Atherosclerotic heart disease of native coronary artery without angina pectoris: Secondary | ICD-10-CM | POA: Insufficient documentation

## 2021-02-16 HISTORY — PX: ATRIAL FIBRILLATION ABLATION: EP1191

## 2021-02-16 LAB — POCT ACTIVATED CLOTTING TIME
Activated Clotting Time: 374 seconds
Activated Clotting Time: 434 seconds

## 2021-02-16 SURGERY — ATRIAL FIBRILLATION ABLATION
Anesthesia: General

## 2021-02-16 MED ORDER — MIDAZOLAM HCL 5 MG/5ML IJ SOLN
INTRAMUSCULAR | Status: DC | PRN
  Administered 2021-02-16: 2 mg via INTRAVENOUS

## 2021-02-16 MED ORDER — PANTOPRAZOLE SODIUM 40 MG PO TBEC: 40.0000 mg | DELAYED_RELEASE_TABLET | Freq: Every day | ORAL | 0 refills | Status: DC

## 2021-02-16 MED ORDER — SUGAMMADEX SODIUM 200 MG/2ML IV SOLN
INTRAVENOUS | Status: DC | PRN
  Administered 2021-02-16: 200 mg via INTRAVENOUS

## 2021-02-16 MED ORDER — ISOPROTERENOL HCL 0.2 MG/ML IJ SOLN
INTRAVENOUS | Status: DC | PRN
  Administered 2021-02-16: 2 ug/min via INTRAVENOUS

## 2021-02-16 MED ORDER — PROTAMINE SULFATE 10 MG/ML IV SOLN
INTRAVENOUS | Status: DC | PRN
  Administered 2021-02-16 (×3): 10 mg via INTRAVENOUS

## 2021-02-16 MED ORDER — SODIUM CHLORIDE 0.9% FLUSH: 3.0000 mL | INTRAVENOUS | Status: DC | PRN

## 2021-02-16 MED ORDER — SODIUM CHLORIDE 0.9 % IV SOLN: 250.0000 mL | INTRAVENOUS | Status: DC | PRN

## 2021-02-16 MED ORDER — ONDANSETRON HCL 4 MG/2ML IJ SOLN
INTRAMUSCULAR | Status: DC | PRN
  Administered 2021-02-16: 4 mg via INTRAVENOUS

## 2021-02-16 MED ORDER — HEPARIN (PORCINE) IN NACL 1000-0.9 UT/500ML-% IV SOLN
INTRAVENOUS | Status: DC | PRN
  Administered 2021-02-16 (×4): 500 mL

## 2021-02-16 MED ORDER — ONDANSETRON HCL 4 MG/2ML IJ SOLN: 4.0000 mg | Freq: Four times a day (QID) | INTRAMUSCULAR | Status: DC | PRN

## 2021-02-16 MED ORDER — DEXAMETHASONE SODIUM PHOSPHATE 10 MG/ML IJ SOLN
INTRAMUSCULAR | Status: DC | PRN
  Administered 2021-02-16: 5 mg via INTRAVENOUS

## 2021-02-16 MED ORDER — LIDOCAINE 2% (20 MG/ML) 5 ML SYRINGE
INTRAMUSCULAR | Status: DC | PRN
  Administered 2021-02-16: 80 mg via INTRAVENOUS

## 2021-02-16 MED ORDER — HEPARIN SODIUM (PORCINE) 1000 UNIT/ML IJ SOLN
INTRAMUSCULAR | Status: DC | PRN
  Administered 2021-02-16: 1000 [IU] via INTRAVENOUS

## 2021-02-16 MED ORDER — PROPOFOL 10 MG/ML IV BOLUS
INTRAVENOUS | Status: DC | PRN
  Administered 2021-02-16: 170 mg via INTRAVENOUS

## 2021-02-16 MED ORDER — SODIUM CHLORIDE 0.9 % IV SOLN: INTRAVENOUS | Status: DC

## 2021-02-16 MED ORDER — SODIUM CHLORIDE 0.9% FLUSH: 3.0000 mL | Freq: Two times a day (BID) | INTRAVENOUS | Status: DC

## 2021-02-16 MED ORDER — FENTANYL CITRATE (PF) 250 MCG/5ML IJ SOLN
INTRAMUSCULAR | Status: DC | PRN
  Administered 2021-02-16: 100 ug via INTRAVENOUS

## 2021-02-16 MED ORDER — PANTOPRAZOLE SODIUM 40 MG PO TBEC
40.0000 mg | DELAYED_RELEASE_TABLET | Freq: Every day | ORAL | Status: DC
  Administered 2021-02-16: 40 mg via ORAL
  Filled 2021-02-16: qty 1

## 2021-02-16 MED ORDER — HEPARIN SODIUM (PORCINE) 1000 UNIT/ML IJ SOLN
INTRAMUSCULAR | Status: DC | PRN
  Administered 2021-02-16: 16000 [IU] via INTRAVENOUS

## 2021-02-16 MED ORDER — ACETAMINOPHEN 325 MG PO TABS
650.0000 mg | ORAL_TABLET | ORAL | Status: DC | PRN
  Filled 2021-02-16: qty 2

## 2021-02-16 MED ORDER — PHENYLEPHRINE HCL (PRESSORS) 10 MG/ML IV SOLN
INTRAVENOUS | Status: DC | PRN
  Administered 2021-02-16 (×2): 80 ug via INTRAVENOUS

## 2021-02-16 MED ORDER — ISOPROTERENOL HCL 0.2 MG/ML IJ SOLN
INTRAMUSCULAR | Status: AC
  Filled 2021-02-16: qty 5

## 2021-02-16 MED ORDER — PHENYLEPHRINE HCL-NACL 10-0.9 MG/250ML-% IV SOLN
INTRAVENOUS | Status: DC | PRN
  Administered 2021-02-16: 25 ug/min via INTRAVENOUS

## 2021-02-16 MED ORDER — ROCURONIUM BROMIDE 10 MG/ML (PF) SYRINGE
PREFILLED_SYRINGE | INTRAVENOUS | Status: DC | PRN
  Administered 2021-02-16: 60 mg via INTRAVENOUS

## 2021-02-16 SURGICAL SUPPLY — 20 items
BLANKET WARM UNDERBOD FULL ACC (MISCELLANEOUS) ×2 IMPLANT
CATH MAPPNG PENTARAY F 2-6-2MM (CATHETERS) ×1 IMPLANT
CATH S CIRCA THERM PROBE 10F (CATHETERS) ×2 IMPLANT
CATH SMTCH THERMOCOOL SF DF (CATHETERS) ×2 IMPLANT
CATH SOUNDSTAR ECO 8FR (CATHETERS) ×2 IMPLANT
CATH WEB BI DIR CSDF CRV REPRO (CATHETERS) ×2 IMPLANT
CLOSURE PERCLOSE PROSTYLE (VASCULAR PRODUCTS) ×6 IMPLANT
COVER SWIFTLINK CONNECTOR (BAG) ×2 IMPLANT
MAT PREVALON FULL STRYKER (MISCELLANEOUS) ×2 IMPLANT
PACK EP LATEX FREE (CUSTOM PROCEDURE TRAY) ×2
PACK EP LF (CUSTOM PROCEDURE TRAY) ×1 IMPLANT
PAD PRO RADIOLUCENT 2001M-C (PAD) ×2 IMPLANT
PATCH CARTO3 (PAD) ×2 IMPLANT
PENTARAY F 2-6-2MM (CATHETERS) ×2
SHEATH BAYLIS TRANSSEPTAL 98CM (NEEDLE) ×2 IMPLANT
SHEATH CARTO VIZIGO SM CVD (SHEATH) ×2 IMPLANT
SHEATH PINNACLE 8F 10CM (SHEATH) ×4 IMPLANT
SHEATH PINNACLE 9F 10CM (SHEATH) ×2 IMPLANT
SHEATH PROBE COVER 6X72 (BAG) ×2 IMPLANT
TUBING SMART ABLATE COOLFLOW (TUBING) ×2 IMPLANT

## 2021-02-16 NOTE — H&P (Signed)
Electrophysiology Office Note:    Date:  12/11/2020   ID:  [name], DOB [birth year], MRN [id number]  PCP:  [name], MD  [facility] Cardiologist:  Lance Muss, MD  [facility] Electrophysiologist:  Lewayne Bunting, MD  Referring MD: [name], MD   Chief Complaint: Paroxysmal atrial fibrillation  History of Present Illness:    Om Lizotte is a 55 y.o. male who presents for an evaluation of paroxysmal atrial fibrillation at the request of Dr. Ladona Ridgel. Their medical history includes coronary artery disease, hypertension, GERD, dyslipidemia.  He last saw Dr. Ladona Ridgel on May 06, 2020.  For his atrial fibrillation he is previously taken dronedarone and beta-blockers.  He is on Xarelto for stroke prophylaxis.  During my appointment with the patient today, he tells me that for several months after starting treatment around his atrial fibrillation episodes were well controlled.  He then started to have brief burst of atrial fibrillation and is now starting to have them more frequently and they are lasting longer.  They are continuing to be symptomatic.  No syncope or presyncope.  No chest pain or shortness of breath.      Past Medical History:  Diagnosis Date  . Anginal pain (HCC)   . Coronary artery disease   . Dyslipidemia   . GERD (gastroesophageal reflux disease)   . Hypertension          Past Surgical History:  Procedure Laterality Date  . ANTERIOR CRUCIATE LIGAMENT REPAIR Left 1996  . CARDIAC CATHETERIZATION N/A 06/10/2015   Procedure: Left Heart Cath and Coronary Angiography;  Surgeon: [name], MD;  Location: [facility] INVASIVE CV LAB;  Service: Cardiovascular;  Laterality: N/A;  . CARDIAC CATHETERIZATION N/A 06/10/2015   Procedure: Coronary Stent Intervention;  Surgeon: [name], MD;  Location: [facility] INVASIVE CV LAB;  Service: Cardiovascular;  Laterality: N/A;  . CORONARY ANGIOPLASTY WITH STENT PLACEMENT  06/10/2015  . MASTECTOMY       Current Medications: Active Medications      Current Meds  Medication Sig  . aspirin EC 81 MG tablet Take 81 mg by mouth daily.  Marland Kitchen atorvastatin (LIPITOR) 40 MG tablet Take 1 tablet (40 mg total) by mouth daily.  . cetirizine (ZYRTEC) 10 MG tablet Take 10 mg by mouth daily as needed for allergies.   Marland Kitchen dronedarone (MULTAQ) 400 MG tablet Take 1 tablet (400 mg total) by mouth 2 (two) times daily with a meal.  . esomeprazole (NEXIUM) 20 MG capsule Take 1 capsule (20 mg total) by mouth daily at 12 noon.  Marland Kitchen losartan (COZAAR) 100 MG tablet Take 1 tablet (100 mg total) by mouth daily.  . metoprolol tartrate (LOPRESSOR) 50 MG tablet Take one tablet by mouth as needed for breakthrough afib.  You may take 2 tablets within 24 hours.  . nitroGLYCERIN (NITROSTAT) 0.4 MG SL tablet Place 1 tablet (0.4 mg total) under the tongue every 5 (five) minutes as needed for chest pain.  Marland Kitchen XARELTO 20 MG TABS tablet TAKE 1 TABLET(20 MG) BY MOUTH DAILY WITH SUPPER       Allergies:   Patient has no known allergies.   Social History        Socioeconomic History  . Marital status: Married    Spouse name: Not on file  . Number of children: Not on file  . Years of education: Not on file  . Highest education level: Not on file  Occupational History  . Not on file  Tobacco Use  .  Smoking status: Never Smoker  . Smokeless tobacco: Never Used  Substance and Sexual Activity  . Alcohol use: Yes    Alcohol/week: 3.0 standard drinks    Types: 3 Cans of beer per week  . Drug use: No  . Sexual activity: Yes  Other Topics Concern  . Not on file  Social History Narrative  . Not on file   Social Determinants of Health   Financial Resource Strain: Not on file  Food Insecurity: Not on file  Transportation Needs: Not on file  Physical Activity: Not on file  Stress: Not on file  Social Connections: Not on file     Family History: The patient's family history includes Healthy in his mother,  sister, and sister; Heart attack in his father; Multiple sclerosis in his sister; Stroke in his father.  ROS:   Please see the history of present illness.    All other systems reviewed and are negative.  EKGs/Labs/Other Studies Reviewed:    The following studies were reviewed today:  Apr 16, 2020 echo personally reviewed Left ventricular function normal, 60% Right ventricular function normal No significant valvular abnormalities  EKG:  The ekg ordered today demonstrates sinus rhythm Recent Labs: 01/29/2020: ALT 24; TSH 1.580 02/06/2020: BUN 14; Creatinine, Ser 1.25; Hemoglobin 14.5; Platelets 238; Potassium 3.6; Sodium 137  Recent Lipid Panel Labs (Brief)          Component Value Date/Time   CHOL 151 01/29/2020 1031   TRIG 134 01/29/2020 1031   HDL 36 (L) 01/29/2020 1031   CHOLHDL 4.2 01/29/2020 1031   CHOLHDL 3.4 02/23/2016 0802   VLDL 13 02/23/2016 0802   LDLCALC 91 01/29/2020 1031      Physical Exam:    VS:  BP 132/86   Pulse 66   Ht 6' (1.829 m)   Wt 227 lb 9.6 oz (103.2 kg)   SpO2 99%   BMI 30.87 kg/m        Wt Readings from Last 3 Encounters:  12/11/20 227 lb 9.6 oz (103.2 kg)  05/06/20 216 lb 9.6 oz (98.2 kg)  02/13/20 217 lb (98.4 kg)     GEN:  Well nourished, well developed in no acute distress HEENT: Normal NECK: No JVD; No carotid bruits LYMPHATICS: No lymphadenopathy CARDIAC: RRR, no murmurs, rubs, gallops RESPIRATORY:  Clear to auscultation without rales, wheezing or rhonchi  ABDOMEN: Soft, non-tender, non-distended MUSCULOSKELETAL:  No edema; No deformity  SKIN: Warm and dry NEUROLOGIC:  Alert and oriented x 3 PSYCHIATRIC:  Normal affect   ASSESSMENT:    1. Paroxysmal atrial fibrillation (HCC)   2. Essential hypertension   3. Hyperlipidemia, unspecified hyperlipidemia type    PLAN:    In order of problems listed above:  1. Symptomatic paroxysmal atrial fibrillation Continues to have symptomatic breakthrough  episodes of atrial fibrillation despite treatment with dronedarone and beta-blocker.  Discussed the treatment options with the patient including continued management with dronedarone, changing antiarrhythmics or pursuing ablation therapy.  Antiarrhythmic therapy options are limited due to his history of coronary artery disease.  I would favor using either sotalol or dofetilide.  I do think he is a very good candidate for ablation given his overall functional status, relatively young age and paroxysmal nature of the atrial fibrillation.  We discussed ablation in detail during today's visit including the associated risks, expected recovery time and typical success rates.  He would like to think about it with his wife more before making a final decision about ablation.  Risk,  benefits, and alternatives to EP study and radiofrequency ablation for afib were also discussed in detail today. These risks include but are not limited to stroke, bleeding, vascular damage, tamponade, perforation, damage to the esophagus, lungs, and other structures, pulmonary vein stenosis, worsening renal function, and death. Carto, ICE, anesthesia would be needed for the procedure.  If we proceed with scheduling, will need a CT PV protocol prior to the procedure to exclude LAA thrombus and further evaluate atrial anatomy.  In the meantime, he will continue his Xarelto for stroke prophylaxis.   2.  Hypertension Continue the losartan and metoprolol  3.  Hyperlipidemia Continue atorvastatin    ----------------------------------------------------------------------------------  I have seen, examined the patient, and reviewed the above assessment and plan.    Plan for PVI today.    Lanier Prude, MD 02/16/2021 7:09 AM

## 2021-02-16 NOTE — Anesthesia Postprocedure Evaluation (Signed)
Anesthesia Post Note  Patient: Niam Nepomuceno  Procedure(s) Performed: ATRIAL FIBRILLATION ABLATION (N/A )     Patient location during evaluation: PACU Anesthesia Type: General Level of consciousness: awake and alert Pain management: pain level controlled Vital Signs Assessment: post-procedure vital signs reviewed and stable Respiratory status: spontaneous breathing, nonlabored ventilation, respiratory function stable and patient connected to nasal cannula oxygen Cardiovascular status: blood pressure returned to baseline and stable Postop Assessment: no apparent nausea or vomiting Anesthetic complications: no   No complications documented.  Last Vitals:  Vitals:   02/16/21 1230 02/16/21 1300  BP: (!) 142/82 131/80  Pulse: 79 80  Resp: 18 (!) 8  Temp:    SpO2: 99% 98%    Last Pain:  Vitals:   02/16/21 1315  TempSrc:   PainSc: 0-No pain                 Earl Lites P Danae Oland

## 2021-02-16 NOTE — Anesthesia Preprocedure Evaluation (Signed)
Anesthesia Evaluation  Patient identified by MRN, date of birth, ID band Patient awake    Reviewed: Allergy & Precautions, NPO status , Patient's Chart, lab work & pertinent test results  Airway Mallampati: II  TM Distance: >3 FB Neck ROM: Full    Dental  (+) Teeth Intact   Pulmonary neg pulmonary ROS,    Pulmonary exam normal        Cardiovascular hypertension, Pt. on medications and Pt. on home beta blockers + CAD  + dysrhythmias Atrial Fibrillation  Rhythm:Irregular Rate:Normal     Neuro/Psych negative neurological ROS  negative psych ROS   GI/Hepatic Neg liver ROS, GERD  Medicated,  Endo/Other  negative endocrine ROS  Renal/GU negative Renal ROS     Musculoskeletal negative musculoskeletal ROS (+)   Abdominal (+)  Abdomen: soft. Bowel sounds: normal.  Peds  Hematology negative hematology ROS (+)   Anesthesia Other Findings   Reproductive/Obstetrics                             Anesthesia Physical Anesthesia Plan  ASA: III  Anesthesia Plan: General   Post-op Pain Management:    Induction: Intravenous  PONV Risk Score and Plan: 2 and Ondansetron, Dexamethasone, Midazolam and Treatment may vary due to age or medical condition  Airway Management Planned: Mask and Oral ETT  Additional Equipment: None  Intra-op Plan:   Post-operative Plan: Extubation in OR  Informed Consent: I have reviewed the patients History and Physical, chart, labs and discussed the procedure including the risks, benefits and alternatives for the proposed anesthesia with the patient or authorized representative who has indicated his/her understanding and acceptance.     Dental advisory given  Plan Discussed with: CRNA  Anesthesia Plan Comments: (Lab Results      Component                Value               Date                      WBC                      5.5                 01/27/2021                 HGB                      13.8                01/27/2021                HCT                      39.8                01/27/2021                MCV                      86                  01/27/2021                PLT  180                 01/27/2021           Lab Results      Component                Value               Date                      NA                       141                 01/27/2021                K                        4.6                 01/27/2021                CO2                      27                  01/27/2021                GLUCOSE                  99                  01/27/2021                BUN                      13                  01/27/2021                CREATININE               0.99                01/27/2021                CALCIUM                  9.5                 01/27/2021                GFRNONAA                 >60                 02/06/2020                GFRAA                    >60                 02/06/2020          )        Anesthesia Quick Evaluation

## 2021-02-16 NOTE — Transfer of Care (Signed)
Immediate Anesthesia Transfer of Care Note  Patient: Marvin Soto  Procedure(s) Performed: ATRIAL FIBRILLATION ABLATION (N/A )  Patient Location: Cath Lab  Anesthesia Type:General  Level of Consciousness: awake, alert  and oriented  Airway & Oxygen Therapy: Patient Spontanous Breathing  Post-op Assessment: Report given to RN and Post -op Vital signs reviewed and stable  Post vital signs: Reviewed and stable  Last Vitals:  Vitals Value Taken Time  BP 144/88 02/16/21 0953  Temp    Pulse 79 02/16/21 0955  Resp 11 02/16/21 0955  SpO2 98 % 02/16/21 0955  Vitals shown include unvalidated device data.  Last Pain:  Vitals:   02/16/21 0606  TempSrc:   PainSc: 0-No pain         Complications: No complications documented.

## 2021-02-16 NOTE — Discharge Instructions (Signed)
Cardiac Ablation, Care After  This sheet gives you information about how to care for yourself after your procedure. Your health care provider may also give you more specific instructions. If you have problems or questions, contact your health care provider. What can I expect after the procedure? After the procedure, it is common to have:  Bruising around your puncture site.  Tenderness around your puncture site.  Skipped heartbeats.  Tiredness (fatigue).  Follow these instructions at home: Puncture site care   Follow instructions from your health care provider about how to take care of your puncture site. Make sure you: ? If present, leave stitches (sutures), skin glue, or adhesive strips in place. These skin closures may need to stay in place for up to 2 weeks. If adhesive strip edges start to loosen and curl up, you may trim the loose edges. Do not remove adhesive strips completely unless your health care provider tells you to do that. ? If a bandage is present, this may be removed in 24 hours.   Check your puncture site every day for signs of infection. Check for: ? Redness, swelling, or pain. ? Fluid or blood. If your puncture site starts to bleed, lie down on your back, apply firm pressure to the area, and contact your health care provider. ? Warmth. ? Pus or a bad smell. Driving  Do not drive for at least 4 days after your procedure or however long your health care provider recommends. (Do not resume driving if you have previously been instructed not to drive for other health reasons.)  Do not drive or use heavy machinery while taking prescription pain medicine. Activity  Avoid activities that take a lot of effort for at least 7 days after your procedure.  Do not lift anything that is heavier than 5 lb (4.5 kg) for one week.   No sexual activity for 1 week.   Return to your normal activities as told by your health care provider. Ask your health care provider what  activities are safe for you. General instructions  Take over-the-counter and prescription medicines only as told by your health care provider.  Do not use any products that contain nicotine or tobacco, such as cigarettes and e-cigarettes. If you need help quitting, ask your health care provider.  You may shower after 24 hours, but Do not take baths, swim, or use a hot tub for 1 week.   Do not drink alcohol for 24 hours after your procedure.  Keep all follow-up visits as told by your health care provider. This is important. Contact a health care provider if:  You have redness, mild swelling, or pain around your puncture site.  You have fluid or blood coming from your puncture site that stops after applying firm pressure to the area.  Your puncture site feels warm to the touch.  You have pus or a bad smell coming from your puncture site.  You have a fever.  You have chest pain or discomfort that spreads to your neck, jaw, or arm.  You are sweating a lot.  You feel nauseous.  You have a fast or irregular heartbeat.  You have shortness of breath.  You are dizzy or light-headed and feel the need to lie down.  You have pain or numbness in the arm or leg closest to your puncture site. Get help right away if:  Your puncture site suddenly swells.  Your puncture site is bleeding and the bleeding does not stop after applying firm pressure  to the area. These symptoms may represent a serious problem that is an emergency. Do not wait to see if the symptoms will go away. Get medical help right away. Call your local emergency services (911 in the [place] Do not drive yourself to the hospital. Summary  After the procedure, it is normal to have bruising and tenderness at the puncture site in your groin, neck, or forearm.  Check your puncture site every day for signs of infection.  Get help right away if your puncture site is bleeding and the bleeding does not stop after applying firm  pressure to the area. This is a medical emergency. This information is not intended to replace advice given to you by your health care provider. Make sure you discuss any questions you have with your health care provider.

## 2021-02-16 NOTE — Anesthesia Procedure Notes (Signed)
Procedure Name: Intubation Date/Time: 02/16/2021 7:44 AM Performed by: Margarita Rana, CRNA Pre-anesthesia Checklist: Patient identified, Patient being monitored, Timeout performed, Emergency Drugs available and Suction available Patient Re-evaluated:Patient Re-evaluated prior to induction Oxygen Delivery Method: Circle System Utilized Preoxygenation: Pre-oxygenation with 100% oxygen Induction Type: IV induction Ventilation: Mask ventilation without difficulty and Two handed mask ventilation required Laryngoscope Size: Glidescope and 4 Grade View: Grade I Tube type: Oral Tube size: 7.5 mm Number of attempts: 1 Airway Equipment and Method: Video-laryngoscopy Placement Confirmation: ETT inserted through vocal cords under direct vision,  positive ETCO2 and breath sounds checked- equal and bilateral Secured at: 21 cm Tube secured with: Tape Dental Injury: Teeth and Oropharynx as per pre-operative assessment  Difficulty Due To: Difficult Airway- due to reduced neck mobility and Difficult Airway- due to limited oral opening

## 2021-02-17 ENCOUNTER — Encounter (HOSPITAL_COMMUNITY): Payer: Self-pay

## 2021-02-17 ENCOUNTER — Telehealth: Payer: Self-pay | Admitting: Cardiology

## 2021-02-17 ENCOUNTER — Emergency Department (HOSPITAL_COMMUNITY)
Admission: EM | Admit: 2021-02-17 | Discharge: 2021-02-17 | Disposition: A | Discharge status: Home / Self Care | Payer: Managed Care, Other (non HMO) | Attending: Emergency Medicine | Admitting: Emergency Medicine

## 2021-02-17 ENCOUNTER — Emergency Department (HOSPITAL_COMMUNITY): Payer: Managed Care, Other (non HMO)

## 2021-02-17 DIAGNOSIS — I309 Acute pericarditis, unspecified: Secondary | ICD-10-CM

## 2021-02-17 DIAGNOSIS — I10 Essential (primary) hypertension: Secondary | ICD-10-CM | POA: Diagnosis not present

## 2021-02-17 DIAGNOSIS — Z79899 Other long term (current) drug therapy: Secondary | ICD-10-CM | POA: Insufficient documentation

## 2021-02-17 DIAGNOSIS — I2510 Atherosclerotic heart disease of native coronary artery without angina pectoris: Secondary | ICD-10-CM | POA: Insufficient documentation

## 2021-02-17 DIAGNOSIS — Z7982 Long term (current) use of aspirin: Secondary | ICD-10-CM | POA: Diagnosis not present

## 2021-02-17 DIAGNOSIS — R079 Chest pain, unspecified: Secondary | ICD-10-CM | POA: Diagnosis not present

## 2021-02-17 LAB — COMPREHENSIVE METABOLIC PANEL
ALT: 41 U/L (ref 0–44)
AST: 30 U/L (ref 15–41)
Albumin: 4.1 g/dL (ref 3.5–5.0)
Alkaline Phosphatase: 75 U/L (ref 38–126)
Anion gap: 9 (ref 5–15)
BUN: 14 mg/dL (ref 6–20)
CO2: 26 mmol/L (ref 22–32)
Calcium: 8.9 mg/dL (ref 8.9–10.3)
Chloride: 103 mmol/L (ref 98–111)
Creatinine, Ser: 1.06 mg/dL (ref 0.61–1.24)
GFR, Estimated: >60 mL/min (ref >60)
Glucose, Bld: 125 mg/dL — ABNORMAL HIGH (ref 70–99)
Potassium: 3.4 mmol/L — ABNORMAL LOW (ref 3.5–5.1)
Sodium: 138 mmol/L (ref 135–145)
Total Bilirubin: 0.8 mg/dL (ref 0.3–1.2)
Total Protein: 6.6 g/dL (ref 6.5–8.1)

## 2021-02-17 LAB — CBC WITH DIFFERENTIAL/PLATELET
Abs Immature Granulocytes: 0.05 10*3/uL (ref 0.00–0.07)
Basophils Absolute: 0 10*3/uL (ref 0.0–0.1)
Basophils Relative: 0 %
Eosinophils Absolute: 0.1 10*3/uL (ref 0.0–0.5)
Eosinophils Relative: 1 %
HCT: 39.6 % (ref 39.0–52.0)
Hemoglobin: 13.4 g/dL (ref 13.0–17.0)
Immature Granulocytes: 0 %
Lymphocytes Relative: 8 %
Lymphs Abs: 1.1 10*3/uL (ref 0.7–4.0)
MCH: 30.2 pg (ref 26.0–34.0)
MCHC: 33.8 g/dL (ref 30.0–36.0)
MCV: 89.4 fL (ref 80.0–100.0)
Monocytes Absolute: 0.8 10*3/uL (ref 0.1–1.0)
Monocytes Relative: 6 %
Neutro Abs: 11.2 10*3/uL — ABNORMAL HIGH (ref 1.7–7.7)
Neutrophils Relative %: 85 %
Platelets: 207 10*3/uL (ref 150–400)
RBC: 4.43 MIL/uL (ref 4.22–5.81)
RDW: 13 % (ref 11.5–15.5)
WBC: 13.2 10*3/uL — ABNORMAL HIGH (ref 4.0–10.5)
nRBC: 0 % (ref 0.0–0.2)

## 2021-02-17 LAB — APTT: aPTT: 29 seconds (ref 24–36)

## 2021-02-17 LAB — PROTIME-INR
INR: 1.6 — ABNORMAL HIGH (ref 0.8–1.2)
Prothrombin Time: 18.5 seconds — ABNORMAL HIGH (ref 11.4–15.2)

## 2021-02-17 LAB — TROPONIN I (HIGH SENSITIVITY)
Troponin I (High Sensitivity): 914 ng/L (ref <18)
Troponin I (High Sensitivity): 776 ng/L (ref <18)

## 2021-02-17 MED ORDER — IBUPROFEN 400 MG PO TABS
400.0000 mg | ORAL_TABLET | Freq: Once | ORAL | Status: AC
  Administered 2021-02-17: 400 mg via ORAL
  Filled 2021-02-17: qty 1

## 2021-02-17 MED ORDER — IBUPROFEN 600 MG PO TABS: 600.0000 mg | ORAL_TABLET | Freq: Three times a day (TID) | ORAL | 0 refills | Status: DC

## 2021-02-17 MED ORDER — COLCHICINE 0.6 MG PO TABS
1.2000 mg | ORAL_TABLET | Freq: Once | ORAL | Status: AC
  Administered 2021-02-17: 1.2 mg via ORAL
  Filled 2021-02-17: qty 2

## 2021-02-17 MED ORDER — COLCHICINE 0.6 MG PO TABS: ORAL_TABLET | ORAL | 0 refills | Status: DC

## 2021-02-17 NOTE — ED Notes (Signed)
Critical trop 914

## 2021-02-17 NOTE — Discharge Instructions (Signed)
Please read and follow all provided instructions.  Your diagnoses today include:  1. Acute pericarditis, unspecified type     Tests performed today include:  An EKG of your heart  A chest x-ray  Cardiac enzymes - a blood test for heart muscle damage, elevated but trending downward  Blood counts and electrolytes  Vital signs. See below for your results today.   Medications prescribed:   Colchicine and ibuprofen  Take any prescribed medications only as directed.  Follow-up instructions: Please follow-up with your primary care provider as soon as you can for further evaluation of your symptoms.   Return instructions:  SEEK IMMEDIATE MEDICAL ATTENTION IF:  You have severe chest pain, especially if the pain is crushing or pressure-like and spreads to the arms, back, neck, or jaw, or if you have sweating, nausea (feeling sick to your stomach), or shortness of breath. THIS IS AN EMERGENCY. Don't wait to see if the pain will go away. Get medical help at once. Call 911 or 0 (operator). DO NOT drive yourself to the hospital.   Your chest pain gets worse and does not go away with rest.   You have an attack of chest pain lasting longer than usual, despite rest and treatment with the medications your caregiver has prescribed.   You wake from sleep with chest pain or shortness of breath.  You feel dizzy or faint.  You have chest pain not typical of your usual pain for which you originally saw your caregiver.   You have any other emergent concerns regarding your health.  Additional Information: Chest pain comes from many different causes. Your caregiver has diagnosed you as having chest pain that is not specific for one problem, but does not require admission.  You are at low risk for an acute heart condition or other serious illness.   Your vital signs today were: BP 135/80   Pulse 99   Temp 98 F (36.7 C) (Oral)   Resp (!) 22   SpO2 97%  If your blood pressure (BP) was  elevated above 135/85 this visit, please have this repeated by your doctor within one month. --------------

## 2021-02-17 NOTE — Consult Note (Signed)
Cardiology Consultation:   Patient ID: Marvin Soto MRN: [id number]; DOB: 1966/04/16  Admit date: 02/17/2021 Date of Consult: 02/17/2021  PCP:  [name], MD   [facility]  Cardiologist:  Lance Muss, MD  Advanced Practice Provider:  No care team member to display Electrophysiologist:  Dr. Benedetto Goad   Patient Profile:   Marvin Soto is a 55 y.o. male with a hx of paroxysmal atrial fibrillation s/p PVI (02/16/21), CAD s/p PCI to LAD, essential hypertension, and hyperlipidemia who is being seen today for the evaluation of chest pain.   History of Present Illness:   [name] underwent pulmonary vein isolation with Dr. Lalla Brothers yesterday.  The procedure was uncomplicated and he was discharged home yesterday afternoon.  This morning [name] noticed a sharp pain in the upper part of his chest.  He has not had pain like this before.  He notes this is significantly different than the discomfort he previously had prior to his LAD stent.  He reports that the pain was significantly worse with deep inspiration.  He also noted that the pain was worse when he laid down.  He called Dr. Lovena Neighbours office and was given instruction to take ibuprofen.  He took 400 mg earlier today but did not note a significant improvement.  Because he was unable to take deep breaths he also felt mildly short of breath.  He attributes this completely to difficulty taking a breath.  He attempted to take a nitroglycerin at home but this did not help a chest pain.  Given the significant change, he presented to the emergency department for evaluation.  On arrival to the emergency department he was hemodynamically stable.  His EKG was relatively unremarkable with no ischemic changes.  He had a chest x-ray performed which did not reveal any acute pathology.  His laboratory evaluation was also rather unremarkable except for an elevated high-sensitivity troponin.  This was initially elevated to  914 and 4 hours later was 776.  Past Medical History:  Diagnosis Date  . Anginal pain (HCC)   . Coronary artery disease   . Dyslipidemia   . GERD (gastroesophageal reflux disease)   . Hypertension     Past Surgical History:  Procedure Laterality Date  . ANTERIOR CRUCIATE LIGAMENT REPAIR Left 1996  . ATRIAL FIBRILLATION ABLATION N/A 02/16/2021   Procedure: ATRIAL FIBRILLATION ABLATION;  Surgeon: Lanier Prude, MD;  Location: [facility] INVASIVE CV LAB;  Service: Cardiovascular;  Laterality: N/A;  . CARDIAC CATHETERIZATION N/A 06/10/2015   Procedure: Left Heart Cath and Coronary Angiography;  Surgeon: [name], MD;  Location: [facility] INVASIVE CV LAB;  Service: Cardiovascular;  Laterality: N/A;  . CARDIAC CATHETERIZATION N/A 06/10/2015   Procedure: Coronary Stent Intervention;  Surgeon: [name], MD;  Location: [facility] INVASIVE CV LAB;  Service: Cardiovascular;  Laterality: N/A;  . CORONARY ANGIOPLASTY WITH STENT PLACEMENT  06/10/2015  . MASTECTOMY       Home Medications:  Prior to Admission medications   Medication Sig Start Date End Date Taking? Authorizing Provider  colchicine 0.6 MG tablet Take 2 tablets PO tomorrow morning (02/18/2021) and 1 tablet PO twice a day thereafter for a total of 7 days 02/17/21  Yes Renne Crigler, PA-C  ibuprofen (ADVIL) 600 MG tablet Take 1 tablet (600 mg total) by mouth 3 (three) times daily. 02/17/21  Yes Renne Crigler, PA-C  aspirin EC 81 MG tablet Take 81 mg by mouth daily.    [provider]  atorvastatin (LIPITOR)  40 MG tablet TAKE 1 TABLET(40 MG) BY MOUTH DAILY Patient taking differently: Take 40 mg by mouth daily. 02/03/21   Lanier PrudeLambert, Cameron T, MD  cetirizine (ZYRTEC) 10 MG tablet Take 10 mg by mouth daily.    [provider]  dronedarone (MULTAQ) 400 MG tablet Take 1 tablet (400 mg total) by mouth 2 (two) times daily with a meal. 02/13/21   Marinus Mawaylor, Gregg W, MD  losartan (COZAAR) 100 MG tablet Take 1 tablet (100 mg total) by  mouth daily. 08/12/20   Marinus Mawaylor, Gregg W, MD  metoprolol tartrate (LOPRESSOR) 50 MG tablet Take one tablet by mouth as needed for breakthrough afib.  You may take 2 tablets within 24 hours. Patient taking differently: Take 50 mg by mouth See admin instructions. Take one tablet by mouth as needed for breakthrough afib.  You may take 2 tablets within 24 hours. 02/27/20   Marinus Mawaylor, Gregg W, MD  Multiple Vitamins-Minerals (MULTIVITAMIN WITH MINERALS) tablet Take 1 tablet by mouth daily.    [provider]  nitroGLYCERIN (NITROSTAT) 0.4 MG SL tablet Place 1 tablet (0.4 mg total) under the tongue every 5 (five) minutes as needed for chest pain. 07/14/16   Corky CraftsVaranasi, Jayadeep S, MD  pantoprazole (PROTONIX) 40 MG tablet Take 1 tablet (40 mg total) by mouth daily. 02/17/21   Graciella Freerillery, Michael Andrew, PA-C  XARELTO 20 MG TABS tablet TAKE 1 TABLET(20 MG) BY MOUTH DAILY WITH SUPPER Patient taking differently: Take 20 mg by mouth every evening. 09/08/20   Marinus Mawaylor, Gregg W, MD    Inpatient Medications: Scheduled Meds:  Continuous Infusions:  PRN Meds:   Allergies:   No Known Allergies  Social History:   Social History   Socioeconomic History  . Marital status: Married    Spouse name: Not on file  . Number of children: Not on file  . Years of education: Not on file  . Highest education level: Not on file  Occupational History  . Not on file  Tobacco Use  . Smoking status: Never Smoker  . Smokeless tobacco: Never Used  Substance and Sexual Activity  . Alcohol use: Yes    Alcohol/week: 3.0 standard drinks    Types: 3 Cans of beer per week  . Drug use: No  . Sexual activity: Yes  Other Topics Concern  . Not on file  Social History Narrative  . Not on file   Social Determinants of Health   Financial Resource Strain: Not on file  Food Insecurity: Not on file  Transportation Needs: Not on file  Physical Activity: Not on file  Stress: Not on file  Social Connections: Not on file   Intimate Partner Violence: Not on file    Family History:    Family History  Problem Relation Age of Onset  . Heart attack Father   . Stroke Father   . Multiple sclerosis Sister   . Healthy Mother   . Healthy Sister   . Healthy Sister      ROS:  Please see the history of present illness.   All other ROS reviewed and negative.     Physical Exam/Data:   Vitals:   02/17/21 2145 02/17/21 2200 02/17/21 2215 02/17/21 2252  BP: 123/72 128/78 135/80 124/85  Pulse: 95 99 99 (!) 101  Resp: (!) 21 (!) 22 (!) 22 (!) 24  Temp:    98 F (36.7 C)  TempSrc:    Oral  SpO2: 97% 97% 97% 96%   No intake or output data  in the 24 hours ending 02/17/21 2328 Last 3 Weights 02/16/2021 12/11/2020 05/06/2020  Weight (lbs) 220 lb 227 lb 9.6 oz 216 lb 9.6 oz  Weight (kg) 99.791 kg 103.239 kg 98.249 kg     There is no height or weight on file to calculate BMI.  General:  Well nourished, well developed, in no acute distress HEENT: normal Lymph: no adenopathy Neck: no JVD Endocrine:  No thryomegaly Vascular: No carotid bruits; FA pulses 2+ bilaterally without bruits  Cardiac:  normal S1, S2; RRR; no murmur, no rub Lungs:  clear to auscultation bilaterally, no wheezing, rhonchi or rales  Abd: soft, nontender, no hepatomegaly  Ext: no edema Musculoskeletal:  No deformities, BUE and BLE strength normal and equal Skin: warm and dry. R groin with mild bruising, no hematoma. L groin also with mild bruising and no hematoma.  Neuro:  CNs 2-12 intact, no focal abnormalities noted Psych:  Normal affect   EKG:  The EKG was personally reviewed and demonstrates:  NSR  Relevant CV Studies: TTE (04/16/20): 1. Left ventricular ejection fraction, by estimation, is 60 to 65%. The  left ventricle has normal function. The left ventricle has no regional  wall motion abnormalities. There is mild concentric left ventricular  hypertrophy. Left ventricular diastolic  parameters were normal.  2. Right ventricular  systolic function is normal. The right ventricular  size is normal. There is normal pulmonary artery systolic pressure.  3. The mitral valve is normal in structure. Trivial mitral valve  regurgitation. No evidence of mitral stenosis.  4. The aortic valve is tricuspid. Aortic valve regurgitation is trivial.  No aortic stenosis is present.  5. The inferior vena cava is normal in size with greater than 50%  respiratory variability, suggesting right atrial pressure of 3 mmHg.   LHC (05/2015):  Mid LAD-2 lesion, 95% stenosed. A 4.0 x 22 resolute drug-eluting stent was deployed. There is a 0% residual stenosis post intervention.  The left ventricular systolic function is normal.  Laboratory Data:  High Sensitivity Troponin:   Recent Labs  Lab 02/17/21 1616 02/17/21 2011  TROPONINIHS 914* 776*     Chemistry Recent Labs  Lab 02/17/21 1616  NA 138  K 3.4*  CL 103  CO2 26  GLUCOSE 125*  BUN 14  CREATININE 1.06  CALCIUM 8.9  GFRNONAA >60  ANIONGAP 9    Recent Labs  Lab 02/17/21 1616  PROT 6.6  ALBUMIN 4.1  AST 30  ALT 41  ALKPHOS 75  BILITOT 0.8   Hematology Recent Labs  Lab 02/17/21 1616  WBC 13.2*  RBC 4.43  HGB 13.4  HCT 39.6  MCV 89.4  MCH 30.2  MCHC 33.8  RDW 13.0  PLT 207   BNPNo results for input(s): BNP, PROBNP in the last 168 hours.  DDimer No results for input(s): DDIMER in the last 168 hours.   Radiology/Studies:  DG Chest 2 View  Result Date: 02/17/2021 CLINICAL DATA:  Chest pain EXAM: CHEST - 2 VIEW COMPARISON:  None. FINDINGS: The heart size and mediastinal contours are within normal limits. Mild hazy airspace opacity is seen at both lung bases. No large airspace consolidation. No pleural effusion. IMPRESSION: Mild hazy airspace opacity at both lung bases which could be due to atelectasis or infectious etiology. Electronically Signed   By: Jonna Clark M.D.   On: 02/17/2021 17:06     Assessment and Plan:  Mr. Holmer is presenting with  chest pain less than 24 hours after pulmonary vein isolation.  His description is rather classic for pericarditis.  His description is not consistent with ischemia or angina, and he specifically notes that it is different than the pain he had prior to his LAD stent.  He does not have any ischemic changes on his EKG. His high-sensitivity troponin is significantly elevated, however, I believe this is due to myocardial injury after his recent ablation.  He had a trivial effusion after his ablation yesterday, and I do not think his symptoms are consistent with an enlarging effusion.  There is no clinical evidence of tamponade.  Prior to my evaluation in the emergency department, I asked our ED colleagues to administer ibuprofen and colchicine.  With this Mr. Cullinane already noted significant improvement.  I discussed discharge from the emergency department with close outpatient monitoring, and he was comfortable with this.  He will continue taking ibuprofen and colchicine, and I assured him that he will be contacted in the next day or 2 to ensure that his symptoms continue to respond.  We discussed to closely monitor for signs of GI bleeding in the coming days.  We will arrange close outpatient follow-up.  Plan: - D/c on colchicine 0.6 mg BID (he will taken 1.2 mg in the AM tomorrow to complete a load) - D/c on ibuprofen 600 mg TID  - Cont protonix - Will arrange close outpatient f/u   Risk Assessment/Risk Scores:   N/A  For questions or updates, please contact [facility] Please consult [email or web address] for contact info under   Signed, Livingston Diones, MD  02/17/2021 11:28 PM

## 2021-02-17 NOTE — ED Triage Notes (Addendum)
Pt reports chest pain and SOB/ dizziness  starting about an hour ago.Pt states he had an ablation done yesterday here at [facility]. Pt states took 2 Nitro and he states  help with catching his breath. Pt reports taking Xarelto and was on heparin for the procedure. Pt denies N/V/D

## 2021-02-17 NOTE — ED Notes (Signed)
Cardiologist at bedside with patient. 

## 2021-02-17 NOTE — Telephone Encounter (Signed)
RN spoke with the patient and he stated that he started having chest pain 20 minutes ago, which is continuous, pt stated he did take a nitroglycerin, but chest pain didn't improve, only the sob. Pt stated that he had pain in his neck, and the back of his head. Pt stated that the chest pain and the sob did get worse when he laid down. RN stated he should go to the ER, and the patient stated that he was on the way as we were taking.    RN stated she would send the message to Dr. Harvel Quale RN

## 2021-02-17 NOTE — Telephone Encounter (Signed)
Called to speak with the patient about some pleuritic sounding chest pain.  Patient tells me that with deep inhalation he experiences some chest discomfort.  He also has some discomfort when he eats.  I suspect he has a mild pericarditis which is common in young patients after ablation.  I have recommended ibuprofen 600 mg by mouth twice daily with food for the next 3 to 5 days.  I have advised him to continue taking Protonix.  If pain worsens he is to reach back out to [place] for a prescription for colchicine.  Sheria Lang T. Lalla Brothers, MD, [facility] Cardiac Electrophysiology

## 2021-02-17 NOTE — ED Provider Notes (Signed)
[facility] EMERGENCY DEPARTMENT Provider Note   CSN: [id number] Arrival date & time: 02/17/21  1550     History Chief Complaint  Patient presents with  . Chest Pain    Marvin Soto is a 55 y.o. male.  Patient with history of coronary artery disease status post 1 stent in 2016 -- presents for evaluation of chest pain.  Patient has a history of atrial fibrillation and is on anticoagulation.  Patient had an ablation performed for A. fib yesterday.  He was intubated during this procedure.  Patient states that this morning he had a sore throat but around 2 PM today he developed chest pain in the mid upper chest that was worse with inspiration.  He has shortness of breath in the sense that he does not feel like he can take a deep breath.  He took 2 nitro without relief.  No nausea, vomiting, or diaphoresis.  He called his cardiology office and was referred to the emergency department.  No fevers, cough.  No abdominal pain, weakness, numbness, or tingling in the arms or the legs.  No strokelike symptoms reported.         Past Medical History:  Diagnosis Date  . Anginal pain (HCC)   . Coronary artery disease   . Dyslipidemia   . GERD (gastroesophageal reflux disease)   . Hypertension     Patient Active Problem List   Diagnosis Date Noted  . Paroxysmal atrial fibrillation (HCC) 02/27/2020  . Palpitations 02/13/2020  . Hyperlipidemia 01/13/2016  . CAD S/P percutaneous coronary angioplasty- DES to mid LAD 06/10/15 06/11/2015  . HTN (hypertension) 06/11/2015  . Abnormal stress test 06/10/2015  . GERD (gastroesophageal reflux disease) 05/28/2015  . Chest pain on exertion 05/28/2015  . Essential hypertension 05/28/2015    Past Surgical History:  Procedure Laterality Date  . ANTERIOR CRUCIATE LIGAMENT REPAIR Left 1996  . ATRIAL FIBRILLATION ABLATION N/A 02/16/2021   Procedure: ATRIAL FIBRILLATION ABLATION;  Surgeon: Lanier Prude, MD;  Location: [facility] INVASIVE CV  LAB;  Service: Cardiovascular;  Laterality: N/A;  . CARDIAC CATHETERIZATION N/A 06/10/2015   Procedure: Left Heart Cath and Coronary Angiography;  Surgeon: [name], MD;  Location: [facility] INVASIVE CV LAB;  Service: Cardiovascular;  Laterality: N/A;  . CARDIAC CATHETERIZATION N/A 06/10/2015   Procedure: Coronary Stent Intervention;  Surgeon: [name], MD;  Location: [facility] INVASIVE CV LAB;  Service: Cardiovascular;  Laterality: N/A;  . CORONARY ANGIOPLASTY WITH STENT PLACEMENT  06/10/2015  . MASTECTOMY         Family History  Problem Relation Age of Onset  . Heart attack Father   . Stroke Father   . Multiple sclerosis Sister   . Healthy Mother   . Healthy Sister   . Healthy Sister     Social History   Tobacco Use  . Smoking status: Never Smoker  . Smokeless tobacco: Never Used  Substance Use Topics  . Alcohol use: Yes    Alcohol/week: 3.0 standard drinks    Types: 3 Cans of beer per week  . Drug use: No    Home Medications Prior to Admission medications   Medication Sig Start Date End Date Taking? Authorizing Provider  aspirin EC 81 MG tablet Take 81 mg by mouth daily.    [provider]  atorvastatin (LIPITOR) 40 MG tablet TAKE 1 TABLET(40 MG) BY MOUTH DAILY Patient taking differently: Take 40 mg by mouth daily. 02/03/21   Lanier Prude, MD  cetirizine Harless Nakayama)  10 MG tablet Take 10 mg by mouth daily.    [provider]  dronedarone (MULTAQ) 400 MG tablet Take 1 tablet (400 mg total) by mouth 2 (two) times daily with a meal. 02/13/21   Marinus Maw, MD  losartan (COZAAR) 100 MG tablet Take 1 tablet (100 mg total) by mouth daily. 08/12/20   Marinus Maw, MD  metoprolol tartrate (LOPRESSOR) 50 MG tablet Take one tablet by mouth as needed for breakthrough afib.  You may take 2 tablets within 24 hours. Patient taking differently: Take 50 mg by mouth See admin instructions. Take one tablet by mouth as needed for breakthrough afib.  You may take  2 tablets within 24 hours. 02/27/20   Marinus Maw, MD  Multiple Vitamins-Minerals (MULTIVITAMIN WITH MINERALS) tablet Take 1 tablet by mouth daily.    [provider]  nitroGLYCERIN (NITROSTAT) 0.4 MG SL tablet Place 1 tablet (0.4 mg total) under the tongue every 5 (five) minutes as needed for chest pain. 07/14/16   [name], MD  pantoprazole (PROTONIX) 40 MG tablet Take 1 tablet (40 mg total) by mouth daily. 02/17/21   Graciella Freer, PA-C  XARELTO 20 MG TABS tablet TAKE 1 TABLET(20 MG) BY MOUTH DAILY WITH SUPPER Patient taking differently: Take 20 mg by mouth every evening. 09/08/20   Marinus Maw, MD    Allergies    Patient has no known allergies.  Review of Systems   Review of Systems  Constitutional: Negative for diaphoresis and fever.  Eyes: Negative for redness.  Respiratory: Positive for shortness of breath. Negative for cough.   Cardiovascular: Positive for chest pain. Negative for palpitations and leg swelling.  Gastrointestinal: Negative for abdominal pain, nausea and vomiting.  Genitourinary: Negative for dysuria.  Musculoskeletal: Negative for back pain and neck pain.  Skin: Negative for rash.  Neurological: Negative for syncope and light-headedness.  Psychiatric/Behavioral: The patient is not nervous/anxious.     Physical Exam Updated Vital Signs BP 133/79 (BP Location: Right Arm)   Pulse 94   Temp 98 F (36.7 C) (Oral)   Resp (!) 24   SpO2 98%   Physical Exam Vitals and nursing note reviewed.  Constitutional:      Appearance: He is well-developed.  HENT:     Head: Normocephalic and atraumatic.  Eyes:     General:        Right eye: No discharge.        Left eye: No discharge.     Conjunctiva/sclera: Conjunctivae normal.  Cardiovascular:     Rate and Rhythm: Normal rate and regular rhythm.     Heart sounds: Normal heart sounds.  Pulmonary:     Effort: Pulmonary effort is normal.     Breath sounds: Normal breath sounds.   Abdominal:     Palpations: Abdomen is soft.     Tenderness: There is no abdominal tenderness.  Musculoskeletal:     Cervical back: Normal range of motion and neck supple.  Skin:    General: Skin is warm and dry.  Neurological:     Mental Status: He is alert.     ED Results / Procedures / Treatments   Labs (all labs ordered are listed, but only abnormal results are displayed) Labs Reviewed  CBC WITH DIFFERENTIAL/PLATELET - Abnormal; Notable for the following components:      Result Value   WBC 13.2 (*)    Neutro Abs 11.2 (*)    All other components within normal limits  PROTIME-INR - Abnormal; Notable for the following components:   Prothrombin Time 18.5 (*)    INR 1.6 (*)    All other components within normal limits  COMPREHENSIVE METABOLIC PANEL - Abnormal; Notable for the following components:   Potassium 3.4 (*)    Glucose, Bld 125 (*)    All other components within normal limits  TROPONIN I (HIGH SENSITIVITY) - Abnormal; Notable for the following components:   Troponin I (High Sensitivity) 914 (*)    All other components within normal limits  APTT  TROPONIN I (HIGH SENSITIVITY)    EKG EKG Interpretation  Date/Time:  Tuesday February 17 2021 16:10:42 EDT Ventricular Rate:  91 PR Interval:  154 QRS Duration: 72 QT Interval:  344 QTC Calculation: 423 R Axis:   28 Text Interpretation: Normal sinus rhythm Normal ECG Confirmed by Marianna Fuss ([id number]) on 02/17/2021 6:44:49 PM   Radiology DG Chest 2 View  Result Date: 02/17/2021 CLINICAL DATA:  Chest pain EXAM: CHEST - 2 VIEW COMPARISON:  None. FINDINGS: The heart size and mediastinal contours are within normal limits. Mild hazy airspace opacity is seen at both lung bases. No large airspace consolidation. No pleural effusion. IMPRESSION: Mild hazy airspace opacity at both lung bases which could be due to atelectasis or infectious etiology. Electronically Signed   By: Jonna Clark M.D.   On: 02/17/2021 17:06     Procedures Procedures   Medications Ordered in ED Medications - No data to display  ED Course  I have reviewed the triage vital signs and the nursing notes.  Pertinent labs & imaging results that were available during my care of the patient were reviewed by me and considered in my medical decision making (see chart for details).  Patient seen and examined. Reviewed recent phone and procedure notes in Epic. Work-up initiated.    Vital signs reviewed and are as follows: BP 133/79 (BP Location: Right Arm)   Pulse 94   Temp 98 F (36.7 C) (Oral)   Resp (!) 24   SpO2 98%   9:04 PM Spoke with Dr. Joyce Gross of cardiology regarding patient. He will see. Awaiting 2nd trop. 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   9:50 PM Trop 914 >> 776. Awaiting cardiology reccs.   10:42 PM patient will be started on colchicine and ibuprofen.  Cardiology will let his EP physician know.   Plan for discharge to home.    MDM Rules/Calculators/A&P                          Patient presents for pleuritic chest pain after ablation yesterday.  Cardiology input obtained.  EKG without concerning findings.  Troponin elevated but downtrending.  Elevated troponin likely related to high procedure yesterday and not an acute ischemic event.  Chest x-ray is clear without signs of pneumothorax or pneumomediastinum.  Cleared for discharge.    Final Clinical Impression(s) / ED Diagnoses Final diagnoses:  Acute pericarditis, unspecified type    Rx / DC Orders ED Discharge Orders         Ordered    colchicine 0.6 MG tablet        02/17/21 2242    ibuprofen (ADVIL) 600 MG tablet  3 times daily        02/17/21 2242           02/19/21, PA-C 02/17/21 2245    Renne Crigler, MD 02/18/21  1157  

## 2021-02-17 NOTE — Telephone Encounter (Signed)
Pt c/o of Chest Pain: STAT if CP now or developed within 24 hours  1. Are you having CP right now? Yes   2. Are you experiencing any other symptoms (ex. SOB, nausea, vomiting, sweating)? SOB, headache  3. How long have you been experiencing CP? About 20 minutes, per patient  4. Is your CP continuous or coming and going? continuous  5. Have you taken Nitroglycerin? Yes, but the nitroglycerin only helped with the SOB. CP is persistent  Pt c/o Shortness Of Breath: STAT if SOB developed within the last 24 hours or pt is noticeably SOB on the phone  1. Are you currently SOB (can you hear that pt is SOB on the phone)? No   2. How long have you been experiencing SOB? Patient states SOB developed about 20 minutes ago, but after taking a nitroglycerin the SOB is relieved   3. Are you SOB when sitting or when up moving around? When sitting. He states it became worse when he laid down   4. Are you currently experiencing any other symptoms? Headache    ?

## 2021-02-25 ENCOUNTER — Other Ambulatory Visit: Payer: Self-pay

## 2021-02-25 ENCOUNTER — Ambulatory Visit (INDEPENDENT_AMBULATORY_CARE_PROVIDER_SITE_OTHER): Payer: Managed Care, Other (non HMO) | Admitting: Student

## 2021-02-25 ENCOUNTER — Encounter: Payer: Self-pay | Admitting: Student

## 2021-02-25 VITALS — BP 120/70 | HR 82 | Ht 71.0 in | Wt 225.0 lb

## 2021-02-25 DIAGNOSIS — I480 Paroxysmal atrial fibrillation: Secondary | ICD-10-CM | POA: Diagnosis not present

## 2021-02-25 DIAGNOSIS — R0789 Other chest pain: Secondary | ICD-10-CM

## 2021-02-25 DIAGNOSIS — I10 Essential (primary) hypertension: Secondary | ICD-10-CM

## 2021-02-25 MED ORDER — NITROGLYCERIN 0.4 MG SL SUBL: 0.4000 mg | SUBLINGUAL_TABLET | SUBLINGUAL | 3 refills | Status: AC | PRN

## 2021-02-25 NOTE — Progress Notes (Signed)
PCP:  [name], MD Primary Cardiologist: Lance Muss, MD Electrophysiologist: Lanier Prude, MD   Marvin Soto is a 55 y.o. male seen today for Lanier Prude, MD for post ED follow up.     Pt called 3/22 with pleuritic feeling chest pain with deep inhalation. Recommended high dose ibuprofen and continued Protonix. He continued to have worsening chest pain and presented to ED. HS trop 914 -> 776. Felt to be due to myocardial injury s/p recent ablation. No clinical evidence of tamponade. Was prescribed colchicine and noted significant improvement in the ED.   Since discharge from hospital the patient reports doing very well. He only took 1-2 additional doses of colchicine post ED visit because it gave him diarrhea. He has continued on ibuprofen until yesterday, and has continued without pain. He has felt brief instances of palpitations similar to when he was in AF before. He has mild dizziness with rapid standing but this is not marked or limiting  he denies chest pain, dyspnea, PND, orthopnea, nausea, vomiting, syncope, edema, weight gain, or early satiety.  Past Medical History:  Diagnosis Date  . Anginal pain (HCC)   . Coronary artery disease   . Dyslipidemia   . GERD (gastroesophageal reflux disease)   . Hypertension    Past Surgical History:  Procedure Laterality Date  . ANTERIOR CRUCIATE LIGAMENT REPAIR Left 1996  . ATRIAL FIBRILLATION ABLATION N/A 02/16/2021   Procedure: ATRIAL FIBRILLATION ABLATION;  Surgeon: Lanier Prude, MD;  Location: [facility] INVASIVE CV LAB;  Service: Cardiovascular;  Laterality: N/A;  . CARDIAC CATHETERIZATION N/A 06/10/2015   Procedure: Left Heart Cath and Coronary Angiography;  Surgeon: [name], MD;  Location: [facility] INVASIVE CV LAB;  Service: Cardiovascular;  Laterality: N/A;  . CARDIAC CATHETERIZATION N/A 06/10/2015   Procedure: Coronary Stent Intervention;  Surgeon: [name], MD;  Location: [facility] INVASIVE CV LAB;   Service: Cardiovascular;  Laterality: N/A;  . CORONARY ANGIOPLASTY WITH STENT PLACEMENT  06/10/2015  . MASTECTOMY      Current Outpatient Medications  Medication Sig Dispense Refill  . aspirin EC 81 MG tablet Take 81 mg by mouth daily.    Marland Kitchen atorvastatin (LIPITOR) 40 MG tablet TAKE 1 TABLET(40 MG) BY MOUTH DAILY 90 tablet 3  . cetirizine (ZYRTEC) 10 MG tablet Take 10 mg by mouth daily.    . colchicine 0.6 MG tablet Take 2 tablets PO tomorrow morning (02/18/2021) and 1 tablet PO twice a day thereafter for a total of 7 days 15 tablet 0  . dronedarone (MULTAQ) 400 MG tablet Take 1 tablet (400 mg total) by mouth 2 (two) times daily with a meal. 180 tablet 3  . ibuprofen (ADVIL) 600 MG tablet Take 1 tablet (600 mg total) by mouth 3 (three) times daily. 21 tablet 0  . losartan (COZAAR) 100 MG tablet Take 1 tablet (100 mg total) by mouth daily. 90 tablet 3  . metoprolol tartrate (LOPRESSOR) 50 MG tablet Take one tablet by mouth as needed for breakthrough afib.  You may take 2 tablets within 24 hours. (Patient taking differently: Take one tablet by mouth as needed for breakthrough afib.  You may take 2 tablets within 24 hours.) 60 tablet 11  . Multiple Vitamins-Minerals (MULTIVITAMIN WITH MINERALS) tablet Take 1 tablet by mouth daily.    . nitroGLYCERIN (NITROSTAT) 0.4 MG SL tablet Place 1 tablet (0.4 mg total) under the tongue every 5 (five) minutes as needed for chest pain. 25 tablet 3  . pantoprazole (  PROTONIX) 40 MG tablet Take 1 tablet (40 mg total) by mouth daily. 45 tablet 0  . XARELTO 20 MG TABS tablet TAKE 1 TABLET(20 MG) BY MOUTH DAILY WITH SUPPER 30 tablet 5   No current facility-administered medications for this visit.    No Known Allergies  Social History   Socioeconomic History  . Marital status: Married    Spouse name: Not on file  . Number of children: Not on file  . Years of education: Not on file  . Highest education level: Not on file  Occupational History  . Not on file   Tobacco Use  . Smoking status: Never Smoker  . Smokeless tobacco: Never Used  Substance and Sexual Activity  . Alcohol use: Yes    Alcohol/week: 3.0 standard drinks    Types: 3 Cans of beer per week  . Drug use: No  . Sexual activity: Yes  Other Topics Concern  . Not on file  Social History Narrative  . Not on file   Social Determinants of Health   Financial Resource Strain: Not on file  Food Insecurity: Not on file  Transportation Needs: Not on file  Physical Activity: Not on file  Stress: Not on file  Social Connections: Not on file  Intimate Partner Violence: Not on file    Review of Systems: All other systems reviewed and are otherwise negative except as noted above.  Physical Exam: Vitals:   02/25/21 0817  BP: 120/70  Pulse: 82  SpO2: 99%  Weight: 225 lb (102.1 kg)  Height: 5\' 11"  (1.803 m)    GEN- The patient is well appearing, alert and oriented x 3 today.   HEENT: normocephalic, atraumatic; sclera clear, conjunctiva pink; hearing intact; oropharynx clear; neck supple, no JVP Lymph- no cervical lymphadenopathy Lungs- Clear to ausculation bilaterally, normal work of breathing.  No wheezes, rales, rhonchi Heart- Regular rate and rhythm, no murmurs, rubs or gallops, PMI not laterally displaced GI- soft, non-tender, non-distended, bowel sounds present, no hepatosplenomegaly Extremities- no clubbing, cyanosis, or edema; DP/PT/radial pulses 2+ bilaterally MS- no significant deformity or atrophy Skin- warm and dry, no rash or lesion Psych- euthymic mood, full affect Neuro- strength and sensation are intact  EKG is not ordered.   Additional studies reviewed include: Recent ED notes and EP notes  Assessment and Plan:  1. Pleuritic chest pain post ablation Improved on colchicine and ibuprofen Continue Protonix for total of 45 days post ablation.   2. Symptomatic paroxysmal atrial fibrillation s/p Ablation  Pt underwent PVI 02/16/2021. Intracardiac echo  showed trivial pericardial effusion as above. Continue xarelto for CHA2DS2VASC of at least 2.    He has felt brief palpitations since ablation. Nothing sustained.  Symptoms have resolved on high dose ibuprofen that he has weaned off of. He only took a few doses of colchicine due to diarrhea. He will follow up in AF clinic in 2 weeks as scheduled, and call 02/18/2021 sooner with any issues.   [place], PA-C  02/25/21 8:19 AM

## 2021-02-25 NOTE — Patient Instructions (Signed)

## 2021-02-26 ENCOUNTER — Telehealth: Payer: Self-pay

## 2021-02-26 NOTE — Telephone Encounter (Signed)
**Note De-Identified Marvin Soto Obfuscation** I started a Xarelto PA through covermymeds and received the following message: RAYSHARD SCHIRTZINGER Key: [id number] Outcome:  Drug is covered by current benefit plan. No further PA activity needed Drug: Xarelto 20MG  tablets Form: [employer] Electronic PA Form (2017 NCPDP)  I have notified [facility] pharmacy of this outcome.

## 2021-03-05 ENCOUNTER — Telehealth (HOSPITAL_COMMUNITY): Payer: Self-pay | Admitting: *Deleted

## 2021-03-05 MED ORDER — METOPROLOL TARTRATE 50 MG PO TABS: 25.0000 mg | ORAL_TABLET | Freq: Two times a day (BID) | ORAL | 11 refills | Status: DC

## 2021-03-05 NOTE — Telephone Encounter (Signed)
Patient called in stating for the last several days he is having more afib and lasting longer. HRs in the 140s - rates responsive to metoprolol PRN but taking longer to convert than pre-ablation.  Discussed with Jorja Loa PA will add metoprolol 25mg  BID instead of just PRN to reduce breakthrough. Pt educated on post-ablation afib is expected but to call if HRs not responsive to medication changes or becomes persistent. Pt in agreement will call if issues arise.

## 2021-03-07 ENCOUNTER — Other Ambulatory Visit: Payer: Self-pay | Admitting: Internal Medicine

## 2021-03-07 DIAGNOSIS — I480 Paroxysmal atrial fibrillation: Secondary | ICD-10-CM

## 2021-03-09 NOTE — Telephone Encounter (Signed)
Age 55, weight 102kg, SCr 1.06 on 02/17/21, CrCl 114 afib indication, last visit 01/2021

## 2021-03-16 ENCOUNTER — Other Ambulatory Visit: Payer: Self-pay

## 2021-03-16 ENCOUNTER — Encounter (HOSPITAL_COMMUNITY): Payer: Self-pay | Admitting: Physician Assistant

## 2021-03-16 ENCOUNTER — Ambulatory Visit (HOSPITAL_COMMUNITY)
Admission: RE | Admit: 2021-03-16 | Discharge: 2021-03-16 | Disposition: A | Discharge status: Home / Self Care | Payer: Managed Care, Other (non HMO) | Source: Ambulatory Visit | Attending: Physician Assistant | Admitting: Physician Assistant

## 2021-03-16 VITALS — BP 122/80 | HR 63 | Ht 71.0 in | Wt 227.8 lb

## 2021-03-16 DIAGNOSIS — Z7901 Long term (current) use of anticoagulants: Secondary | ICD-10-CM | POA: Diagnosis not present

## 2021-03-16 DIAGNOSIS — Z7982 Long term (current) use of aspirin: Secondary | ICD-10-CM | POA: Insufficient documentation

## 2021-03-16 DIAGNOSIS — D6869 Other thrombophilia: Secondary | ICD-10-CM | POA: Diagnosis not present

## 2021-03-16 DIAGNOSIS — E669 Obesity, unspecified: Secondary | ICD-10-CM | POA: Insufficient documentation

## 2021-03-16 DIAGNOSIS — Z8249 Family history of ischemic heart disease and other diseases of the circulatory system: Secondary | ICD-10-CM | POA: Diagnosis not present

## 2021-03-16 DIAGNOSIS — Z955 Presence of coronary angioplasty implant and graft: Secondary | ICD-10-CM | POA: Diagnosis not present

## 2021-03-16 DIAGNOSIS — Z79899 Other long term (current) drug therapy: Secondary | ICD-10-CM | POA: Diagnosis not present

## 2021-03-16 DIAGNOSIS — E785 Hyperlipidemia, unspecified: Secondary | ICD-10-CM | POA: Diagnosis not present

## 2021-03-16 DIAGNOSIS — I10 Essential (primary) hypertension: Secondary | ICD-10-CM | POA: Insufficient documentation

## 2021-03-16 DIAGNOSIS — I480 Paroxysmal atrial fibrillation: Secondary | ICD-10-CM | POA: Diagnosis present

## 2021-03-16 DIAGNOSIS — I2510 Atherosclerotic heart disease of native coronary artery without angina pectoris: Secondary | ICD-10-CM | POA: Diagnosis not present

## 2021-03-16 DIAGNOSIS — Z6831 Body mass index (BMI) 31.0-31.9, adult: Secondary | ICD-10-CM | POA: Diagnosis not present

## 2021-03-16 MED ORDER — METOPROLOL TARTRATE 25 MG PO TABS: 25.0000 mg | ORAL_TABLET | Freq: Two times a day (BID) | ORAL | 2 refills | Status: DC

## 2021-03-16 NOTE — Progress Notes (Signed)
Primary Care Physician: [name], MD Primary Cardiologist: Dr Eldridge Dace Primary Electrophysiologist: Dr Ladona Ridgel Referring Physician: Dr Lalla Brothers   Marvin Soto is a 55 y.o. male with a history of CAD, HTN, HLD, atrial fibrillation who presents for follow up in the [facility]. Patient is on Xarelto for a CHADS2VASC score of 2. He continue to have breakthrough episodes of afib on Multaq and underwent afib ablation with Dr Lalla Brothers on 02/16/21. He presented to the ED with pleuritic chest pain and was started on colchicine. He denies any further CP, swallowing pain, or groin issues. He did have some breakthrough episodes of afib and his BB was increased to BID. He has not had any further heart racing or palpitations.   Today, he denies symptoms of palpitations, chest pain, shortness of breath, orthopnea, PND, lower extremity edema, dizziness, presyncope, syncope, snoring, daytime somnolence, bleeding, or neurologic sequela. The patient is tolerating medications without difficulties and is otherwise without complaint today.    Atrial Fibrillation Risk Factors:  he does not have symptoms or diagnosis of sleep apnea. he does not have a history of rheumatic fever.   he has a BMI of Body mass index is 31.77 kg/m.Marland Kitchen Filed Weights   03/16/21 1105  Weight: 103.3 kg    Family History  Problem Relation Age of Onset  . Heart attack Father   . Stroke Father   . Multiple sclerosis Sister   . Healthy Mother   . Healthy Sister   . Healthy Sister      Atrial Fibrillation Management history:  Previous antiarrhythmic drugs: Multaq Previous cardioversions: none Previous ablations: 02/16/21 CHADS2VASC score: 2 Anticoagulation history: Xarelto    Past Medical History:  Diagnosis Date  . Anginal pain (HCC)   . Coronary artery disease   . Dyslipidemia   . GERD (gastroesophageal reflux disease)   . Hypertension    Past Surgical History:  Procedure Laterality  Date  . ANTERIOR CRUCIATE LIGAMENT REPAIR Left 1996  . ATRIAL FIBRILLATION ABLATION N/A 02/16/2021   Procedure: ATRIAL FIBRILLATION ABLATION;  Surgeon: Lanier Prude, MD;  Location: [facility] INVASIVE CV LAB;  Service: Cardiovascular;  Laterality: N/A;  . CARDIAC CATHETERIZATION N/A 06/10/2015   Procedure: Left Heart Cath and Coronary Angiography;  Surgeon: [name], MD;  Location: [facility] INVASIVE CV LAB;  Service: Cardiovascular;  Laterality: N/A;  . CARDIAC CATHETERIZATION N/A 06/10/2015   Procedure: Coronary Stent Intervention;  Surgeon: [name], MD;  Location: [facility] INVASIVE CV LAB;  Service: Cardiovascular;  Laterality: N/A;  . CORONARY ANGIOPLASTY WITH STENT PLACEMENT  06/10/2015  . MASTECTOMY      Current Outpatient Medications  Medication Sig Dispense Refill  . aspirin EC 81 MG tablet Take 81 mg by mouth daily.    Marland Kitchen atorvastatin (LIPITOR) 40 MG tablet TAKE 1 TABLET(40 MG) BY MOUTH DAILY 90 tablet 3  . cetirizine (ZYRTEC) 10 MG tablet Take 10 mg by mouth daily.    Marland Kitchen dronedarone (MULTAQ) 400 MG tablet Take 1 tablet (400 mg total) by mouth 2 (two) times daily with a meal. 180 tablet 3  . ibuprofen (ADVIL) 600 MG tablet Take 1 tablet (600 mg total) by mouth 3 (three) times daily. 21 tablet 0  . losartan (COZAAR) 100 MG tablet Take 1 tablet (100 mg total) by mouth daily. 90 tablet 3  . metoprolol tartrate (LOPRESSOR) 50 MG tablet Take 0.5 tablets (25 mg total) by mouth 2 (two) times daily. 60 tablet 11  . Multiple Vitamins-Minerals (  MULTIVITAMIN WITH MINERALS) tablet Take 1 tablet by mouth daily.    . nitroGLYCERIN (NITROSTAT) 0.4 MG SL tablet Place 1 tablet (0.4 mg total) under the tongue every 5 (five) minutes as needed for chest pain. 25 tablet 3  . pantoprazole (PROTONIX) 40 MG tablet Take 1 tablet (40 mg total) by mouth daily. 45 tablet 0  . XARELTO 20 MG TABS tablet TAKE 1 TABLET(20 MG) BY MOUTH DAILY WITH SUPPER 30 tablet 5   No current facility-administered medications  for this encounter.    No Known Allergies  Social History   Socioeconomic History  . Marital status: Married    Spouse name: Not on file  . Number of children: Not on file  . Years of education: Not on file  . Highest education level: Not on file  Occupational History  . Not on file  Tobacco Use  . Smoking status: Never Smoker  . Smokeless tobacco: Never Used  Substance and Sexual Activity  . Alcohol use: Yes    Alcohol/week: 3.0 standard drinks    Types: 3 Cans of beer per week  . Drug use: No  . Sexual activity: Yes  Other Topics Concern  . Not on file  Social History Narrative  . Not on file   Social Determinants of Health   Financial Resource Strain: Not on file  Food Insecurity: Not on file  Transportation Needs: Not on file  Physical Activity: Not on file  Stress: Not on file  Social Connections: Not on file  Intimate Partner Violence: Not on file     ROS- All systems are reviewed and negative except as per the HPI above.  Physical Exam: Vitals:   03/16/21 1105  BP: 122/80  Pulse: 63  Weight: 103.3 kg  Height: 5\' 11"  (1.803 m)    GEN- The patient is a well appearing obese male, alert and oriented x 3 today.   Head- normocephalic, atraumatic Eyes-  Sclera clear, conjunctiva pink Ears- hearing intact Oropharynx- clear Neck- supple  Lungs- Clear to ausculation bilaterally, normal work of breathing Heart- Regular rate and rhythm, no murmurs, rubs or gallops  GI- soft, NT, ND, + BS Extremities- no clubbing, cyanosis, or edema MS- no significant deformity or atrophy Skin- no rash or lesion Psych- euthymic mood, full affect Neuro- strength and sensation are intact  Wt Readings from Last 3 Encounters:  03/16/21 103.3 kg  02/25/21 102.1 kg  02/16/21 99.8 kg    EKG today demonstrates  SR Vent. rate 63 BPM PR interval 140 ms QRS duration 70 ms QT/QTcB 396/405 ms  Echo 04/16/20 demonstrated  1. Left ventricular ejection fraction, by  estimation, is 60 to 65%. The  left ventricle has normal function. The left ventricle has no regional  wall motion abnormalities. There is mild concentric left ventricular  hypertrophy. Left ventricular diastolic  parameters were normal.  2. Right ventricular systolic function is normal. The right ventricular  size is normal. There is normal pulmonary artery systolic pressure.  3. The mitral valve is normal in structure. Trivial mitral valve  regurgitation. No evidence of mitral stenosis.  4. The aortic valve is tricuspid. Aortic valve regurgitation is trivial.  No aortic stenosis is present.  5. The inferior vena cava is normal in size with greater than 50%  respiratory variability, suggesting right atrial pressure of 3 mmHg.  Epic records are reviewed at length today  CHA2DS2-VASc Score = 2  The patient's score is based upon: CHF History: No HTN History: Yes Diabetes  History: No Stroke History: No Vascular Disease History: Yes Age Score: 0 Gender Score: 0      ASSESSMENT AND PLAN: 1. Paroxysmal Atrial Fibrillation (ICD10:  I48.0) The patient's CHA2DS2-VASc score is 2, indicating a 2.2% annual risk of stroke.   S/p afib ablation with Dr Lalla Brothers on 02/16/21. Reassured that some breakthrough episodes of afib are not uncommon soon after ablation.  Continue Multaq 400 mg BID Continue Xarelto 20 mg daily with no missed doses for at least 3 months post ablation.  Continue Lopressor 25 mg BID  2. Secondary Hypercoagulable State (ICD10:  D68.69) The patient is at significant risk for stroke/thromboembolism based upon his CHA2DS2-VASc Score of 2.  Continue Rivaroxaban (Xarelto).   3. Obesity Body mass index is 31.77 kg/m. Lifestyle modification was discussed at length including regular exercise and weight reduction.  4. CAD No anginal symptoms.   5. HTN Stable, no changes today.   Follow up with Dr Lalla Brothers as scheduled.    Jorja Loa PA-C [facility] [address] [place], [place] [address] [phone] 03/16/2021 11:11 AM

## 2021-03-31 ENCOUNTER — Other Ambulatory Visit: Payer: Self-pay | Admitting: Student

## 2021-05-21 NOTE — Progress Notes (Signed)
Electrophysiology Office Follow up Visit Note:    Date:  05/22/2021   ID:  Marvin Soto, DOB [birth year], MRN [id number]  PCP:  [name], MD  [facility] Cardiologist:  Lance Muss, MD  [facility] Electrophysiologist:  Lanier Prude, MD    Interval History:    Marvin Soto is a 55 y.o. male who presents for a follow up visit.  The patient underwent a successful atrial fibrillation ablation on February 16, 2021.  During the ablation the pulmonary veins were isolated.  The patient was seen in follow-up by Jorja Loa, PA-C on March 16, 2021.  At the time of his appointment with Clide [place], he was still taking Multaq 400 mg by mouth twice daily and Xarelto 20 mg by mouth once daily for stroke prophylaxis.  He did have some atrial fibrillation shortly after the ablation that was managed with an increased dose of beta-blocker but these seem to have calmed down.  He tells me he has done well since a couple weeks after the ablation.  No sustained arrhythmias.  He has been taking metoprolol 25 mg by mouth twice daily and Multaq.  He is taking Xarelto for stroke prophylaxis without bleeding issues.  He does tell me he is having intermittent chest discomfort.  The discomfort is not clearly related to exertion.  Comes on at random times and seems to stay in the chest without radiation.  He is also having a different type of pain in his low back which he has been attributing to recent weight gain.  He does have a history of coronary artery disease with a prior stent placed in the LAD.    Past Medical History:  Diagnosis Date   Anginal pain (HCC)    Coronary artery disease    Dyslipidemia    GERD (gastroesophageal reflux disease)    Hypertension     Past Surgical History:  Procedure Laterality Date   ANTERIOR CRUCIATE LIGAMENT REPAIR Left 1996   ATRIAL FIBRILLATION ABLATION N/A 02/16/2021   Procedure: ATRIAL FIBRILLATION ABLATION;  Surgeon: Lanier Prude, MD;  Location: [facility]  INVASIVE CV LAB;  Service: Cardiovascular;  Laterality: N/A;   CARDIAC CATHETERIZATION N/A 06/10/2015   Procedure: Left Heart Cath and Coronary Angiography;  Surgeon: [name], MD;  Location: [facility] INVASIVE CV LAB;  Service: Cardiovascular;  Laterality: N/A;   CARDIAC CATHETERIZATION N/A 06/10/2015   Procedure: Coronary Stent Intervention;  Surgeon: [name], MD;  Location: [facility] INVASIVE CV LAB;  Service: Cardiovascular;  Laterality: N/A;   CORONARY ANGIOPLASTY WITH STENT PLACEMENT  06/10/2015   MASTECTOMY      Current Medications: Current Meds  Medication Sig   aspirin EC 81 MG tablet Take 81 mg by mouth daily.   atorvastatin (LIPITOR) 40 MG tablet TAKE 1 TABLET(40 MG) BY MOUTH DAILY   cetirizine (ZYRTEC) 10 MG tablet Take 10 mg by mouth daily.   dronedarone (MULTAQ) 400 MG tablet Take 1 tablet (400 mg total) by mouth 2 (two) times daily with a meal.   ibuprofen (ADVIL) 600 MG tablet Take 1 tablet (600 mg total) by mouth 3 (three) times daily.   losartan (COZAAR) 100 MG tablet Take 1 tablet (100 mg total) by mouth daily.   metoprolol tartrate (LOPRESSOR) 25 MG tablet Take 1 tablet (25 mg total) by mouth 2 (two) times daily.   nitroGLYCERIN (NITROSTAT) 0.4 MG SL tablet Place 1 tablet (0.4 mg total) under the tongue every 5 (five) minutes as needed for chest pain.  XARELTO 20 MG TABS tablet TAKE 1 TABLET(20 MG) BY MOUTH DAILY WITH SUPPER     Allergies:   Patient has no known allergies.   Social History   Socioeconomic History   Marital status: Married    Spouse name: Not on file   Number of children: Not on file   Years of education: Not on file   Highest education level: Not on file  Occupational History   Not on file  Tobacco Use   Smoking status: Never   Smokeless tobacco: Never  Substance and Sexual Activity   Alcohol use: Yes    Alcohol/week: 3.0 standard drinks    Types: 3 Cans of beer per week   Drug use: No   Sexual activity: Yes  Other Topics Concern    Not on file  Social History Narrative   Not on file   Social Determinants of Health   Financial Resource Strain: Not on file  Food Insecurity: Not on file  Transportation Needs: Not on file  Physical Activity: Not on file  Stress: Not on file  Social Connections: Not on file     Family History: The patient's family history includes Healthy in his mother, sister, and sister; Heart attack in his father; Multiple sclerosis in his sister; Stroke in his father.  ROS:   Please see the history of present illness.    All other systems reviewed and are negative.  EKGs/Labs/Other Studies Reviewed:    The following studies were reviewed today:  June 10, 2015 left heart cath Mid LAD-2 lesion, 95% stenosed. A 4.0 x 22 resolute drug-eluting stent was deployed. There is a 0% residual stenosis post intervention. The left ventricular systolic function is normal.     EKG:  The ekg ordered today demonstrates normal sinus rhythm  Recent Labs: 02/17/2021: ALT 41; BUN 14; Creatinine, Ser 1.06; Hemoglobin 13.4; Platelets 207; Potassium 3.4; Sodium 138  Recent Lipid Panel    Component Value Date/Time   CHOL 151 01/29/2020 1031   TRIG 134 01/29/2020 1031   HDL 36 (L) 01/29/2020 1031   CHOLHDL 4.2 01/29/2020 1031   CHOLHDL 3.4 02/23/2016 0802   VLDL 13 02/23/2016 0802   LDLCALC 91 01/29/2020 1031    Physical Exam:    VS:  BP 126/80   Pulse 65   Ht 5\' 11"  (1.803 m)   Wt 226 lb (102.5 kg)   SpO2 98%   BMI 31.52 kg/m     Wt Readings from Last 3 Encounters:  05/22/21 226 lb (102.5 kg)  03/16/21 227 lb 12.8 oz (103.3 kg)  02/25/21 225 lb (102.1 kg)     GEN:  Well nourished, well developed in no acute distress HEENT: Normal NECK: No JVD; No carotid bruits LYMPHATICS: No lymphadenopathy CARDIAC: RRR, no murmurs, rubs, gallops RESPIRATORY:  Clear to auscultation without rales, wheezing or rhonchi  ABDOMEN: Soft, non-tender, non-distended MUSCULOSKELETAL:  No edema; No deformity   SKIN: Warm and dry NEUROLOGIC:  Alert and oriented x 3 PSYCHIATRIC:  Normal affect   ASSESSMENT:    1. Paroxysmal atrial fibrillation (HCC)   2. Essential hypertension   3. CAD S/P percutaneous coronary angioplasty   4. Chest pain of uncertain etiology   5. Precordial pain    PLAN:    In order of problems listed above:  1. Paroxysmal atrial fibrillation (HCC) Maintaining sinus rhythm after his ablation.  Continue Xarelto. We will stop Multaq today. Continue metoprolol.  Follow-up 9 months or sooner as needed.  2. Essential  hypertension At goal.  Continue home meds.  3. CAD S/P percutaneous coronary angioplasty 4. Chest pain of uncertain etiology The patient tells me that he is having some intermittent chest pain that sounds atypical for coronary artery narrowings or blockages.  Given his history of an LAD stent, I would favor assessing this further to eliminate the possibility of critical ischemia.  We will order an exercise nuclear stress test to further evaluate.   Follow-up 9 months or sooner as needed.   Total time spent with patient today 35 minutes. This includes reviewing records, evaluating the patient and coordinating care.   Medication Adjustments/Labs and Tests Ordered: Current medicines are reviewed at length with the patient today.  Concerns regarding medicines are outlined above.  Orders Placed This Encounter  Procedures   Myocardial Perfusion Imaging   EKG 12-Lead    No orders of the defined types were placed in this encounter.    Signed, Steffanie Dunn, MD, [facility], [facility] 05/22/2021 9:47 AM    Electrophysiology [facility]

## 2021-05-22 ENCOUNTER — Ambulatory Visit (INDEPENDENT_AMBULATORY_CARE_PROVIDER_SITE_OTHER): Payer: Managed Care, Other (non HMO) | Admitting: Cardiology

## 2021-05-22 ENCOUNTER — Encounter: Payer: Self-pay | Admitting: Cardiology

## 2021-05-22 ENCOUNTER — Other Ambulatory Visit: Payer: Self-pay

## 2021-05-22 VITALS — BP 126/80 | HR 65 | Ht 71.0 in | Wt 226.0 lb

## 2021-05-22 DIAGNOSIS — I2510 Atherosclerotic heart disease of native coronary artery without angina pectoris: Secondary | ICD-10-CM | POA: Diagnosis not present

## 2021-05-22 DIAGNOSIS — I480 Paroxysmal atrial fibrillation: Secondary | ICD-10-CM

## 2021-05-22 DIAGNOSIS — I10 Essential (primary) hypertension: Secondary | ICD-10-CM

## 2021-05-22 DIAGNOSIS — R079 Chest pain, unspecified: Secondary | ICD-10-CM

## 2021-05-22 DIAGNOSIS — R072 Precordial pain: Secondary | ICD-10-CM

## 2021-05-22 DIAGNOSIS — Z9861 Coronary angioplasty status: Secondary | ICD-10-CM

## 2021-05-22 NOTE — Patient Instructions (Addendum)
Medication Instructions:  Your physician has recommended you make the following change in your medication:    STOP Multaq  *If you need a refill on your cardiac medications before your next appointment, please call your pharmacy*  Lab Work: None ordered. If you have labs (blood work) drawn today and your tests are completely normal, you will receive your results only by: MyChart Message (if you have MyChart) OR A paper copy in the mail If you have any lab test that is abnormal or we need to change your treatment, we will call you to review the results.  Testing/Procedures: You will have an exercise nuclear stress test.  Please schedule for exercise myoview.  Follow-Up: At [facility], you and your health needs are our priority.  As part of our continuing mission to provide you with exceptional heart care, we have created designated Provider Care Teams.  These Care Teams include your primary Cardiologist (physician) and Advanced Practice Providers (APPs -  Physician Assistants and Nurse Practitioners) who all work together to provide you with the care you need, when you need it.  Your next appointment:   Your physician wants you to follow-up in: 9 months with Dr. Lalla Brothers.   You will receive a reminder letter in the mail two months in advance. If you don't receive a letter, please call our office to schedule the follow-up appointment.

## 2021-05-25 ENCOUNTER — Telehealth: Payer: Self-pay

## 2021-05-25 DIAGNOSIS — R079 Chest pain, unspecified: Secondary | ICD-10-CM

## 2021-06-02 ENCOUNTER — Telehealth (HOSPITAL_COMMUNITY): Payer: Self-pay | Admitting: *Deleted

## 2021-06-02 NOTE — Telephone Encounter (Signed)
Patient given detailed instructions per Myocardial Perfusion Study Information Sheet for the test on 06/08/21 at 7:45. Patient notified to arrive 15 minutes early and that it is imperative to arrive on time for appointment to keep from having the test rescheduled.  If you need to cancel or reschedule your appointment, please call the office within 24 hours of your appointment. . Patient verbalized understanding.Marvin Soto

## 2021-06-02 NOTE — Telephone Encounter (Signed)
Attestation signed.  Shalina Norfolk T. Jeromiah Ohalloran, MD, FACC, [facility] Cardiac Electrophysiology  

## 2021-06-08 ENCOUNTER — Ambulatory Visit (HOSPITAL_COMMUNITY): Payer: Managed Care, Other (non HMO) | Attending: Internal Medicine

## 2021-06-08 ENCOUNTER — Other Ambulatory Visit: Payer: Self-pay

## 2021-06-08 DIAGNOSIS — R079 Chest pain, unspecified: Secondary | ICD-10-CM

## 2021-06-08 DIAGNOSIS — R072 Precordial pain: Secondary | ICD-10-CM

## 2021-06-08 LAB — MYOCARDIAL PERFUSION IMAGING
Estimated workload: 8.5 METS
Exercise duration (min): 7 min
LV dias vol: 62 mL (ref 62–150)
LV sys vol: 20 mL
MPHR: 165 {beats}/min
Peak HR: 171 {beats}/min
Percent HR: 103 %
Rest HR: 77 {beats}/min
SDS: 0
SRS: 0
SSS: 0
TID: 0.82

## 2021-06-08 MED ORDER — TECHNETIUM TC 99M TETROFOSMIN IV KIT
30.7000 | PACK | Freq: Once | INTRAVENOUS | Status: AC | PRN
  Administered 2021-06-08: 30.7 via INTRAVENOUS
  Filled 2021-06-08: qty 31

## 2021-06-08 MED ORDER — TECHNETIUM TC 99M TETROFOSMIN IV KIT
10.1000 | PACK | Freq: Once | INTRAVENOUS | Status: AC | PRN
  Administered 2021-06-08: 10.1 via INTRAVENOUS
  Filled 2021-06-08: qty 11

## 2021-07-03 ENCOUNTER — Telehealth: Payer: Self-pay | Admitting: *Deleted

## 2021-07-03 MED ORDER — METOPROLOL TARTRATE 25 MG PO TABS: 25.0000 mg | ORAL_TABLET | Freq: Two times a day (BID) | ORAL | 3 refills | Status: DC

## 2021-07-03 NOTE — Telephone Encounter (Signed)
-----   Message from Learta Codding, CMA sent at 07/03/2021  9:51 AM EDT ----- Patient needs refills on his Metoprolol 25mg - twice daily Please fill prescription. He recently saw Dr. back in June.  Thanks so much [facility] CMA A-fib [facility]

## 2021-08-07 MED ORDER — LOSARTAN POTASSIUM 100 MG PO TABS: 100.0000 mg | ORAL_TABLET | Freq: Every day | ORAL | 2 refills | Status: DC

## 2021-10-28 ENCOUNTER — Encounter: Payer: Self-pay | Admitting: Cardiology

## 2022-01-25 ENCOUNTER — Other Ambulatory Visit: Payer: Self-pay | Admitting: Physician Assistant

## 2022-02-24 ENCOUNTER — Other Ambulatory Visit: Payer: Self-pay | Admitting: Internal Medicine

## 2022-02-24 DIAGNOSIS — I480 Paroxysmal atrial fibrillation: Secondary | ICD-10-CM

## 2022-02-24 NOTE — Telephone Encounter (Signed)
Prescription refill request for Xarelto received.  ?Indication: Afib  ?Last office visit: 05/22/21 Marvin Soto) ?Weight: 102.5kg ?Age: 56 ?Scr: 0.99 (06/19/21) ?CrCl: 120.46ml/min ? ?Appropriate dose and refill sent to requested pharmacy.  ?

## 2022-03-11 ENCOUNTER — Encounter: Payer: Self-pay | Admitting: Cardiology

## 2022-03-23 DIAGNOSIS — Z86007 Personal history of in-situ neoplasm of skin: Secondary | ICD-10-CM | POA: Diagnosis not present

## 2022-03-23 DIAGNOSIS — L570 Actinic keratosis: Secondary | ICD-10-CM | POA: Diagnosis not present

## 2022-03-23 DIAGNOSIS — D225 Melanocytic nevi of trunk: Secondary | ICD-10-CM | POA: Diagnosis not present

## 2022-03-23 DIAGNOSIS — L578 Other skin changes due to chronic exposure to nonionizing radiation: Secondary | ICD-10-CM | POA: Diagnosis not present

## 2022-03-23 DIAGNOSIS — D2272 Melanocytic nevi of left lower limb, including hip: Secondary | ICD-10-CM | POA: Diagnosis not present

## 2022-04-25 ENCOUNTER — Other Ambulatory Visit: Payer: Self-pay | Admitting: Cardiology

## 2022-05-15 ENCOUNTER — Other Ambulatory Visit: Payer: Self-pay | Admitting: Internal Medicine

## 2022-05-25 ENCOUNTER — Other Ambulatory Visit: Payer: Self-pay | Admitting: Cardiology

## 2022-05-31 DIAGNOSIS — E782 Mixed hyperlipidemia: Secondary | ICD-10-CM | POA: Diagnosis not present

## 2022-05-31 DIAGNOSIS — Z125 Encounter for screening for malignant neoplasm of prostate: Secondary | ICD-10-CM | POA: Diagnosis not present

## 2022-05-31 DIAGNOSIS — I10 Essential (primary) hypertension: Secondary | ICD-10-CM | POA: Diagnosis not present

## 2022-05-31 DIAGNOSIS — E669 Obesity, unspecified: Secondary | ICD-10-CM | POA: Diagnosis not present

## 2022-05-31 DIAGNOSIS — K529 Noninfective gastroenteritis and colitis, unspecified: Secondary | ICD-10-CM | POA: Diagnosis not present

## 2022-05-31 DIAGNOSIS — N529 Male erectile dysfunction, unspecified: Secondary | ICD-10-CM | POA: Diagnosis not present

## 2022-05-31 DIAGNOSIS — Z0000 Encounter for general adult medical examination without abnormal findings: Secondary | ICD-10-CM | POA: Diagnosis not present

## 2022-06-02 ENCOUNTER — Encounter: Payer: Self-pay | Admitting: Cardiology

## 2022-06-10 DIAGNOSIS — Z1211 Encounter for screening for malignant neoplasm of colon: Secondary | ICD-10-CM | POA: Diagnosis not present

## 2022-07-04 ENCOUNTER — Other Ambulatory Visit: Payer: Self-pay | Admitting: Cardiology

## 2022-07-06 ENCOUNTER — Encounter: Payer: Self-pay | Admitting: Cardiology

## 2022-07-06 ENCOUNTER — Other Ambulatory Visit: Payer: Self-pay | Admitting: Cardiology

## 2022-07-06 ENCOUNTER — Other Ambulatory Visit: Payer: Self-pay

## 2022-07-06 MED ORDER — METOPROLOL TARTRATE 25 MG PO TABS: 25.0000 mg | ORAL_TABLET | Freq: Two times a day (BID) | ORAL | 0 refills | Status: DC

## 2022-07-06 MED ORDER — ATORVASTATIN CALCIUM 40 MG PO TABS: 40.0000 mg | ORAL_TABLET | Freq: Every day | ORAL | 0 refills | Status: DC

## 2022-07-06 NOTE — Telephone Encounter (Signed)
Patient asking for Lipitor refill. Made overdue follow up with Dr. Eldridge Dace for 08/19/22 and refilled medication until that appointment.   Made overdue appointment with Dr. Lalla Brothers for 10/24. Metoprolol tartrate has already been sent in today for 1 month supply.

## 2022-08-07 ENCOUNTER — Other Ambulatory Visit: Payer: Self-pay | Admitting: Cardiology

## 2022-08-09 ENCOUNTER — Other Ambulatory Visit: Payer: Self-pay | Admitting: Cardiology

## 2022-08-10 ENCOUNTER — Other Ambulatory Visit: Payer: Self-pay

## 2022-08-10 MED ORDER — ATORVASTATIN CALCIUM 40 MG PO TABS: 40.0000 mg | ORAL_TABLET | Freq: Every day | ORAL | 0 refills | Status: DC

## 2022-08-10 NOTE — Addendum Note (Signed)
Addended by: Margaret Pyle D on: 08/10/2022 08:23 AM   Modules accepted: Orders

## 2022-08-17 NOTE — Progress Notes (Unsigned)
Cardiology Office Note   Date:  08/19/2022   ID:  Marvin Soto, DOB Sep 25, 1966, MRN [id number]  PCP:  Marvin Joe, MD    No chief complaint on file.  CAD/AFib  Wt Readings from Last 3 Encounters:  08/19/22 213 lb (96.6 kg)  06/08/21 226 lb (102.5 kg)  05/22/21 226 lb (102.5 kg)       History of Present Illness: Marvin Soto is a 56 y.o. male   with h/o HTN and family h/o CAD (father with MI at age 62), admitted for elective LHC on 06/10/2015 after undergoing an abnormal NST conducted for chest pain eval.  He was found to have a 95% stenosed mid LAD, successfully treated with PCI + DES. LVF was normal with EF of 55-65%.  He was placed on DAPT with ASA + Plavix. He was also continued on a statin and ARB. Low dose BB therapy with Lopressor was added. His Nexium was changed to Protonix given concomitant use of Plavix. Imdur was discontinued as it was felt his angina will resolve post PCI.   2018 stress test showed:  1. Normal exercise tolerance.  2. No evidence for ischemia by ST segment analysis.     In the past, "He has pushed himself with a kayaking tournament.  He was exercising vigorously as well.  Did not lose weight but did not even feel a twinge.     He has had some GERD.  He feels that the nexium worked better.  He bruises a lot less than before.    He was taking Vyvanse occasionally, but noticed his HR was higher.  He has stopped Vyvanse sinece that time."   Had palpitations, HR up to 190 by his Apple watch.  Ultimately diagnosed with AFib.  Followed with EP.  Ultimately had ablation.   Stress test in 7/22: "The left ventricular ejection fraction is hyperdynamic (>65%). Nuclear stress EF: 67%. There was no ST segment deviation noted during stress. No T wave inversion was noted during stress. This is a low risk study. The study is normal."  One episode of AFib in early 9/23.  Resolved spontaneously.   Denies : Chest pain. Dizziness. Leg edema. Nitroglycerin  use. Orthopnea.  Paroxysmal nocturnal dyspnea. Shortness of breath. Syncope.    Staying active as well.  He started Ozempic and lost 20 lbs. BP decreased. Losartan was decreased to 50 mg daily.  BP has been better.   Past Medical History:  Diagnosis Date   Anginal pain (HCC)    Coronary artery disease    Dyslipidemia    GERD (gastroesophageal reflux disease)    Hypertension     Past Surgical History:  Procedure Laterality Date   ANTERIOR CRUCIATE LIGAMENT REPAIR Left 1996   ATRIAL FIBRILLATION ABLATION N/A 02/16/2021   Procedure: ATRIAL FIBRILLATION ABLATION;  Surgeon: Marvin Prude, MD;  Location: [facility] INVASIVE CV LAB;  Service: Cardiovascular;  Laterality: N/A;   CARDIAC CATHETERIZATION N/A 06/10/2015   Procedure: Left Heart Cath and Coronary Angiography;  Surgeon: Marvin Crafts, MD;  Location: [facility] INVASIVE CV LAB;  Service: Cardiovascular;  Laterality: N/A;   CARDIAC CATHETERIZATION N/A 06/10/2015   Procedure: Coronary Stent Intervention;  Surgeon: Marvin Crafts, MD;  Location: [facility] INVASIVE CV LAB;  Service: Cardiovascular;  Laterality: N/A;   CORONARY ANGIOPLASTY WITH STENT PLACEMENT  06/10/2015   MASTECTOMY       Current Outpatient Medications  Medication Sig Dispense Refill   aspirin EC 81 MG tablet Take 81 mg  by mouth daily.     atorvastatin (LIPITOR) 40 MG tablet Take 1 tablet (40 mg total) by mouth daily. 30 tablet 0   cetirizine (ZYRTEC) 10 MG tablet Take 10 mg by mouth daily.     ibuprofen (ADVIL) 600 MG tablet Take 1 tablet (600 mg total) by mouth 3 (three) times daily. 21 tablet 0   losartan (COZAAR) 50 MG tablet Take 50 mg by mouth daily.     metoprolol tartrate (LOPRESSOR) 25 MG tablet TAKE 1 TABLET(25 MG) BY MOUTH TWICE DAILY 60 tablet 0   nitroGLYCERIN (NITROSTAT) 0.4 MG SL tablet Place 1 tablet (0.4 mg total) under the tongue every 5 (five) minutes as needed for chest pain. 25 tablet 3   OZEMPIC, 1 MG/DOSE, 4 MG/3ML SOPN Inject into the skin once a week.      XARELTO 20 MG TABS tablet TAKE 1 TABLET BY MOUTH DAILY WITH SUPPER 30 tablet 5   No current facility-administered medications for this visit.    Allergies:   Patient has no known allergies.    Social History:  The patient  reports that he has never smoked. He has never used smokeless tobacco. He reports current alcohol use of about 3.0 standard drinks of alcohol per week. He reports that he does not use drugs.   Family History:  The patient's family history includes Healthy in his mother, sister, and sister; Heart attack in his father; Multiple sclerosis in his sister; Stroke in his father.    ROS:  Please see the history of present illness.   Otherwise, review of systems are positive for occasional snoring.   All other systems are reviewed and negative.    PHYSICAL EXAM: VS:  BP 122/88   Pulse 77   Ht 5\' 11"  (1.803 m)   Wt 213 lb (96.6 kg)   SpO2 98%   BMI 29.71 kg/m  , BMI Body mass index is 29.71 kg/m. GEN: Well nourished, well developed, in no acute distress HEENT: normal Neck: no JVD, carotid bruits, or masses Cardiac: RRR; no murmurs, rubs, or gallops,no edema  Respiratory:  clear to auscultation bilaterally, normal work of breathing GI: soft, nontender, nondistended, + BS MS: no deformity or atrophy Skin: warm and dry, no rash Neuro:  Strength and sensation are intact Psych: euthymic mood, full affect   EKG:   The ekg ordered today demonstrates normal ECG   Recent Labs: No results found for requested labs within last 365 days.   Lipid Panel    Component Value Date/Time   CHOL 151 01/29/2020 1031   TRIG 134 01/29/2020 1031   HDL 36 (L) 01/29/2020 1031   CHOLHDL 4.2 01/29/2020 1031   CHOLHDL 3.4 02/23/2016 0802   VLDL 13 02/23/2016 0802   LDLCALC 91 01/29/2020 1031     Other studies Reviewed: Additional studies/ records that were reviewed today with results demonstrating: labs reviewed, LDL 57 in 7/23.   ASSESSMENT AND PLAN:  CAD: No angina.  Continue aggressive secondary prevention.   PAF: s/p ablation. Multaq stopped in June 2023.  Can take an extra 25 mg ,metoprolol.  He denies any sleep apnea symptoms.  Wife has not observed apnea per his report.  He will discuss stopping anticoagulation with Dr. Quentin Ore at the next visit if no further symptoms of atrial fibrillation. He has an Apple Watch to detect any atrial fibrillation going forward. HTN: The current medical regimen is effective;  continue present plan and medications. Hyperlipidemia: Continue statin.  Weight loss with ozempic-  eating smaller portions.   Current medicines are reviewed at length with the patient today.  The patient concerns regarding his medicines were addressed.  The following changes have been made:  No change  Labs/ tests ordered today include:  No orders of the defined types were placed in this encounter.   Recommend 150 minutes/week of aerobic exercise Low fat, low carb, high fiber diet recommended  Disposition:   FU in 1 year   Signed, Larae Grooms, MD  08/19/2022 1:55 PM    [facility] [address], [place], [place]  [address] Phone: [phone]; Fax: [phone]

## 2022-08-19 ENCOUNTER — Ambulatory Visit: Payer: BC Managed Care – PPO | Attending: Interventional Cardiology | Admitting: Interventional Cardiology

## 2022-08-19 ENCOUNTER — Encounter: Payer: Self-pay | Admitting: Interventional Cardiology

## 2022-08-19 VITALS — BP 122/88 | HR 77 | Ht 71.0 in | Wt 213.0 lb

## 2022-08-19 DIAGNOSIS — I2510 Atherosclerotic heart disease of native coronary artery without angina pectoris: Secondary | ICD-10-CM | POA: Diagnosis not present

## 2022-08-19 DIAGNOSIS — Z9861 Coronary angioplasty status: Secondary | ICD-10-CM | POA: Diagnosis not present

## 2022-08-19 DIAGNOSIS — E782 Mixed hyperlipidemia: Secondary | ICD-10-CM | POA: Diagnosis not present

## 2022-08-19 DIAGNOSIS — I480 Paroxysmal atrial fibrillation: Secondary | ICD-10-CM | POA: Diagnosis not present

## 2022-08-19 DIAGNOSIS — I10 Essential (primary) hypertension: Secondary | ICD-10-CM | POA: Diagnosis not present

## 2022-08-19 NOTE — Patient Instructions (Signed)
Medication Instructions:  Your physician recommends that you continue on your current medications as directed. Please refer to the Current Medication list given to you today.  *If you need a refill on your cardiac medications before your next appointment, please call your pharmacy*   Follow-Up: At [facility], you and your health needs are our priority.  As part of our continuing mission to provide you with exceptional heart care, we have created designated Provider Care Teams.  These Care Teams include your primary Cardiologist (physician) and Advanced Practice Providers (APPs -  Physician Assistants and Nurse Practitioners) who all work together to provide you with the care you need, when you need it.   Your next appointment:   1 year(s)  The format for your next appointment:   In Person  Provider:   Larae Grooms, MD

## 2022-09-03 ENCOUNTER — Other Ambulatory Visit: Payer: Self-pay | Admitting: Cardiology

## 2022-09-03 DIAGNOSIS — I480 Paroxysmal atrial fibrillation: Secondary | ICD-10-CM

## 2022-09-03 NOTE — Telephone Encounter (Signed)
Prescription refill request for Xarelto received.  Indication:Afib Last office visit:9/23 Weight:96.6 kg Age:56 Scr:1.1 CrCl:102.45 ml/min  Prescription refilled

## 2022-09-12 ENCOUNTER — Other Ambulatory Visit: Payer: Self-pay | Admitting: Cardiology

## 2022-09-14 ENCOUNTER — Other Ambulatory Visit: Payer: Self-pay | Admitting: Cardiology

## 2022-09-20 ENCOUNTER — Other Ambulatory Visit: Payer: Self-pay

## 2022-09-20 MED ORDER — LOSARTAN POTASSIUM 50 MG PO TABS: 50.0000 mg | ORAL_TABLET | Freq: Every day | ORAL | 3 refills | Status: AC

## 2022-09-20 NOTE — Progress Notes (Deleted)
Electrophysiology Office Follow up Visit Note:    Date:  09/20/2022   ID:  Marvin Soto, DOB 1966/03/22, MRN [id number]  PCP:  [name], MD  [facility] Cardiologist:  Lance Muss, MD  [facility] Electrophysiologist:  Lanier Prude, MD    Interval History:    Marvin Soto is a 56 y.o. male who presents for a follow up visit. They were last seen in clinic May 22, 2021 for his atrial fibrillation.  He has a history of A-fib ablation on February 16, 2021.  His Multaq was stopped at the last appointment.  He takes Xarelto for stroke prophylaxis.       Past Medical History:  Diagnosis Date   Anginal pain (HCC)    Coronary artery disease    Dyslipidemia    GERD (gastroesophageal reflux disease)    Hypertension     Past Surgical History:  Procedure Laterality Date   ANTERIOR CRUCIATE LIGAMENT REPAIR Left 1996   ATRIAL FIBRILLATION ABLATION N/A 02/16/2021   Procedure: ATRIAL FIBRILLATION ABLATION;  Surgeon: Lanier Prude, MD;  Location: [facility] INVASIVE CV LAB;  Service: Cardiovascular;  Laterality: N/A;   CARDIAC CATHETERIZATION N/A 06/10/2015   Procedure: Left Heart Cath and Coronary Angiography;  Surgeon: [name], MD;  Location: [facility] INVASIVE CV LAB;  Service: Cardiovascular;  Laterality: N/A;   CARDIAC CATHETERIZATION N/A 06/10/2015   Procedure: Coronary Stent Intervention;  Surgeon: [name], MD;  Location: [facility] INVASIVE CV LAB;  Service: Cardiovascular;  Laterality: N/A;   CORONARY ANGIOPLASTY WITH STENT PLACEMENT  06/10/2015   MASTECTOMY      Current Medications: No outpatient medications have been marked as taking for the 09/21/22 encounter (Office Visit) with Lanier Prude, MD.     Allergies:   Patient has no known allergies.   Social History   Socioeconomic History   Marital status: Married    Spouse name: Not on file   Number of children: Not on file   Years of education: Not on file   Highest education level: Not on  file  Occupational History   Not on file  Tobacco Use   Smoking status: Never   Smokeless tobacco: Never  Substance and Sexual Activity   Alcohol use: Yes    Alcohol/week: 3.0 standard drinks of alcohol    Types: 3 Cans of beer per week   Drug use: No   Sexual activity: Yes  Other Topics Concern   Not on file  Social History Narrative   Not on file   Social Determinants of Health   Financial Resource Strain: Not on file  Food Insecurity: Not on file  Transportation Needs: Not on file  Physical Activity: Not on file  Stress: Not on file  Social Connections: Not on file     Family History: The patient's family history includes Healthy in his mother, sister, and sister; Heart attack in his father; Multiple sclerosis in his sister; Stroke in his father.  ROS:   Please see the history of present illness.    All other systems reviewed and are negative.  EKGs/Labs/Other Studies Reviewed:    The following studies were reviewed today:   EKG:  The ekg ordered today demonstrates ***  Recent Labs: No results found for requested labs within last 365 days.  Recent Lipid Panel    Component Value Date/Time   CHOL 151 01/29/2020 1031   TRIG 134 01/29/2020 1031   HDL 36 (L) 01/29/2020 1031   CHOLHDL 4.2 01/29/2020 1031  CHOLHDL 3.4 02/23/2016 0802   VLDL 13 02/23/2016 0802   LDLCALC 91 01/29/2020 1031    Physical Exam:    VS:  There were no vitals taken for this visit.    Wt Readings from Last 3 Encounters:  08/19/22 213 lb (96.6 kg)  06/08/21 226 lb (102.5 kg)  05/22/21 226 lb (102.5 kg)     GEN: *** Well nourished, well developed in no acute distress HEENT: Normal NECK: No JVD; No carotid bruits LYMPHATICS: No lymphadenopathy CARDIAC: ***RRR, no murmurs, rubs, gallops RESPIRATORY:  Clear to auscultation without rales, wheezing or rhonchi  ABDOMEN: Soft, non-tender, non-distended MUSCULOSKELETAL:  No edema; No deformity  SKIN: Warm and dry NEUROLOGIC:  Alert  and oriented x 3 PSYCHIATRIC:  Normal affect        ASSESSMENT:    1. Paroxysmal atrial fibrillation (HCC)   2. Essential hypertension   3. CAD S/P percutaneous coronary angioplasty    PLAN:    In order of problems listed above:   #Paroxysmal atrial fibrillation Doing well after his ablation in March 2021.  On Xarelto for stroke prophylaxis.  CHA2DS2-VASc Score = 2  The patient's score is based upon: CHF History: 0 HTN History: 1 Diabetes History: 0 Stroke History: 0 Vascular Disease History: 1 Age Score: 0 Gender Score: 0    #Hypertension *** goal today.  Recommend checking blood pressures 1-2 times per week at home and recording the values.  Recommend bringing these recordings to the primary care physician.    Follow-up 1 year with APP.    Total time spent with patient today *** minutes. This includes reviewing records, evaluating the patient and coordinating care.   Medication Adjustments/Labs and Tests Ordered: Current medicines are reviewed at length with the patient today.  Concerns regarding medicines are outlined above.  No orders of the defined types were placed in this encounter.  No orders of the defined types were placed in this encounter.    Signed, Lars Mage, MD, [facility], [facility] 09/20/2022 8:09 PM    Electrophysiology [facility]

## 2022-09-21 ENCOUNTER — Ambulatory Visit: Payer: BC Managed Care – PPO | Attending: Cardiology | Admitting: Cardiology

## 2022-09-21 ENCOUNTER — Encounter: Payer: Self-pay | Admitting: Cardiology

## 2022-09-21 VITALS — BP 118/78 | HR 78 | Ht 71.0 in | Wt 213.0 lb

## 2022-09-21 DIAGNOSIS — I480 Paroxysmal atrial fibrillation: Secondary | ICD-10-CM

## 2022-09-21 DIAGNOSIS — I2510 Atherosclerotic heart disease of native coronary artery without angina pectoris: Secondary | ICD-10-CM | POA: Diagnosis not present

## 2022-09-21 DIAGNOSIS — Z9861 Coronary angioplasty status: Secondary | ICD-10-CM

## 2022-09-21 DIAGNOSIS — I10 Essential (primary) hypertension: Secondary | ICD-10-CM | POA: Diagnosis not present

## 2022-09-21 NOTE — Patient Instructions (Signed)
Medication Instructions:  none *If you need a refill on your cardiac medications before your next appointment, please call your pharmacy*   Lab Work: non If you have labs (blood work) drawn today and your tests are completely normal, you will receive your results only by: Olivia (if you have MyChart) OR A paper copy in the mail If you have any lab test that is abnormal or we need to change your treatment, we will call you to review the results.   Testing/Procedures: none   Follow-Up: At [facility], you and your health needs are our priority.  As part of our continuing mission to provide you with exceptional heart care, we have created designated Provider Care Teams.  These Care Teams include your primary Cardiologist (physician) and Advanced Practice Providers (APPs -  Physician Assistants and Nurse Practitioners) who all work together to provide you with the care you need, when you need it.  We recommend signing up for the patient portal called "MyChart".  Sign up information is provided on this After Visit Summary.  MyChart is used to connect with patients for Virtual Visits (Telemedicine).  Patients are able to view lab/test results, encounter notes, upcoming appointments, etc.  Non-urgent messages can be sent to your provider as well.   To learn more about what you can do with MyChart, go to [email or web address].    Your next appointment:   1 year(s)  The format for your next appointment:   In Person  Provider:   Legrand Como "Oda Kilts, PA-C    Other Instructions None   Important Information About Sugar

## 2022-09-21 NOTE — Progress Notes (Signed)
Electrophysiology Office Follow up Visit Note:    Date:  09/21/2022   ID:  Marvin Soto, DOB Dec 22, 1965, MRN [id number]  PCP:  Antony Contras, MD  [place] Cardiologist:  Larae Grooms, MD  [facility] Electrophysiologist:  Vickie Epley, MD    Interval History:    Marvin Soto is a 56 y.o. male who presents for a follow up visit. They were last seen in clinic May 22, 2021 for his atrial fibrillation.  He has a history of A-fib ablation on February 16, 2021.  His Multaq was stopped at the last appointment.  He takes Xarelto for stroke prophylaxis.  Today, the patient states that he feels good and has only been in a-fib once for 10 minutes two months ago which was related to exertion. The patient states that he feels that he has had less arrhythmia post ablation.  The patient has been taking metoprolol and xarelto and has had no issues. He was worried about possible side effects from the xarelto which we addressed.  He denies any palpitations, chest pain, shortness of breath, or peripheral edema. No lightheadedness, headaches, syncope, orthopnea, or PND.  Patient has been doing very well.  In the last year he has had a 10-minute episode of atrial fibrillation that was self-limited.  There is been a significant improvement in his atrial fibrillation post ablation.       Past Medical History:  Diagnosis Date   Anginal pain ([place])    Coronary artery disease    Dyslipidemia    GERD (gastroesophageal reflux disease)    Hypertension     Past Surgical History:  Procedure Laterality Date   ANTERIOR CRUCIATE LIGAMENT REPAIR Left 1996   ATRIAL FIBRILLATION ABLATION N/A 02/16/2021   Procedure: ATRIAL FIBRILLATION ABLATION;  Surgeon: Vickie Epley, MD;  Location: [facility];  Service: Cardiovascular;  Laterality: N/A;   CARDIAC CATHETERIZATION N/A 06/10/2015   Procedure: Left Heart Cath and Coronary Angiography;  Surgeon: Jettie Booze, MD;  Location: [facility];  Service: Cardiovascular;  Laterality: N/A;   CARDIAC CATHETERIZATION N/A 06/10/2015   Procedure: Coronary Stent Intervention;  Surgeon: Jettie Booze, MD;  Location: [facility];  Service: Cardiovascular;  Laterality: N/A;   CORONARY ANGIOPLASTY WITH STENT PLACEMENT  06/10/2015   MASTECTOMY      Current Medications: Current Meds  Medication Sig   aspirin EC 81 MG tablet Take 81 mg by mouth daily.   atorvastatin (LIPITOR) 40 MG tablet Take 1 tablet (40 mg total) by mouth daily.   cetirizine (ZYRTEC) 10 MG tablet Take 10 mg by mouth daily.   ibuprofen (ADVIL) 600 MG tablet Take 1 tablet (600 mg total) by mouth 3 (three) times daily.   losartan (COZAAR) 50 MG tablet Take 1 tablet (50 mg total) by mouth daily.   metoprolol tartrate (LOPRESSOR) 25 MG tablet TAKE 1 TABLET(25 MG) BY MOUTH TWICE DAILY   nitroGLYCERIN (NITROSTAT) 0.4 MG SL tablet Place 1 tablet (0.4 mg total) under the tongue every 5 (five) minutes as needed for chest pain.   XARELTO 20 MG TABS tablet TAKE 1 TABLET BY MOUTH DAILY WITH SUPPER   [DISCONTINUED] OZEMPIC, 1 MG/DOSE, 4 MG/3ML SOPN Inject into the skin once a week.     Allergies:   Patient has no known allergies.   Social History   Socioeconomic History   Marital status: Married    Spouse name: Not on file   Number of children: Not on file  Years of education: Not on file   Highest education level: Not on file  Occupational History   Not on file  Tobacco Use   Smoking status: Never   Smokeless tobacco: Never  Substance and Sexual Activity   Alcohol use: Yes    Alcohol/week: 3.0 standard drinks of alcohol    Types: 3 Cans of beer per week   Drug use: No   Sexual activity: Yes  Other Topics Concern   Not on file  Social History Narrative   Not on file   Social Determinants of Health   Financial Resource Strain: Not on file  Food Insecurity: Not on file  Transportation Needs: Not on file  Physical Activity: Not on file   Stress: Not on file  Social Connections: Not on file   Family History: The patient's family history includes Healthy in his mother, sister, and sister; Heart attack in his father; Multiple sclerosis in his sister; Stroke in his father.  ROS:   Please see the history of present illness.     All other systems reviewed and are negative.  EKGs/Labs/Other Studies Reviewed:    The following studies were reviewed today:  EKG:  The ekg ordered today demonstrates NSR  Recent Labs: No results found for requested labs within last 365 days.  Recent Lipid Panel    Component Value Date/Time   CHOL 151 01/29/2020 1031   TRIG 134 01/29/2020 1031   HDL 36 (L) 01/29/2020 1031   CHOLHDL 4.2 01/29/2020 1031   CHOLHDL 3.4 02/23/2016 0802   VLDL 13 02/23/2016 0802   LDLCALC 91 01/29/2020 1031   Physical Exam:    VS:  BP 118/78   Pulse 78   Ht 5\' 11"  (1.803 m)   Wt 213 lb (96.6 kg)   SpO2 98%   BMI 29.71 kg/m     Wt Readings from Last 3 Encounters:  09/21/22 213 lb (96.6 kg)  08/19/22 213 lb (96.6 kg)  06/08/21 226 lb (102.5 kg)    GEN: Well nourished, well developed in no acute distress HEENT: Normal NECK: No JVD; No carotid bruits LYMPHATICS: No lymphadenopathy CARDIAC: RRR, no murmurs, rubs, gallops RESPIRATORY:  Clear to auscultation without rales, wheezing or rhonchi  ABDOMEN: Soft, non-tender, non-distended MUSCULOSKELETAL:  No edema; No deformity  SKIN: Warm and dry NEUROLOGIC:  Alert and oriented x 3 PSYCHIATRIC:  Normal affect      ASSESSMENT:    1. Paroxysmal atrial fibrillation (HCC)   2. Essential hypertension   3. CAD S/P percutaneous coronary angioplasty    PLAN:    In order of problems listed above:  #Paroxysmal atrial fibrillation Doing well after his ablation in March 2021.  On Xarelto for stroke prophylaxis.  CHA2DS2-VASc Score = 2  The patient's score is based upon: CHF History: 0 HTN History: 1 Diabetes History: 0 Stroke History: 0 Vascular  Disease History: 1 Age Score: 0 Gender Score: 0  #Hypertension At goal today.  Recommend checking blood pressures 1-2 times per week at home and recording the values.  Recommend bringing these recordings to the primary care physician.  Follow-up 1 year with APP.  Medication Adjustments/Labs and Tests Ordered: Current medicines are reviewed at length with the patient today.  Concerns regarding medicines are outlined above.  No orders of the defined types were placed in this encounter.  No orders of the defined types were placed in this encounter.  I,Coren O'Brien,acting as a Education administrator for Vickie Epley, MD.,have documented all relevant documentation on  the behalf of Vickie Epley, MD,as directed by  Vickie Epley, MD while in the presence of Vickie Epley, MD.  I, Vickie Epley, MD, have reviewed all documentation for this visit. The documentation on 09/21/22 for the exam, diagnosis, procedures, and orders are all accurate and complete.   Signed, Lars Mage, MD, [facility], [facility] 09/21/2022 8:10 AM    Electrophysiology [facility]

## 2022-11-02 ENCOUNTER — Other Ambulatory Visit: Payer: Self-pay | Admitting: Interventional Cardiology

## 2022-11-02 ENCOUNTER — Ambulatory Visit (INDEPENDENT_AMBULATORY_CARE_PROVIDER_SITE_OTHER): Payer: BC Managed Care – PPO

## 2022-11-02 ENCOUNTER — Ambulatory Visit
Admission: EM | Admit: 2022-11-02 | Discharge: 2022-11-02 | Disposition: A | Discharge status: Home / Self Care | Payer: BC Managed Care – PPO | Attending: Urgent Care | Admitting: Urgent Care

## 2022-11-02 DIAGNOSIS — R070 Pain in throat: Secondary | ICD-10-CM | POA: Diagnosis not present

## 2022-11-02 DIAGNOSIS — R059 Cough, unspecified: Secondary | ICD-10-CM | POA: Diagnosis not present

## 2022-11-02 DIAGNOSIS — B349 Viral infection, unspecified: Secondary | ICD-10-CM | POA: Diagnosis not present

## 2022-11-02 DIAGNOSIS — U071 COVID-19: Secondary | ICD-10-CM | POA: Insufficient documentation

## 2022-11-02 LAB — RESP PANEL BY RT-PCR (FLU A&B, COVID) ARPGX2
Influenza A by PCR: NEGATIVE
Influenza B by PCR: NEGATIVE
SARS Coronavirus 2 by RT PCR: POSITIVE — AB

## 2022-11-02 LAB — POCT RAPID STREP A (OFFICE): Rapid Strep A Screen: NEGATIVE

## 2022-11-02 MED ORDER — IPRATROPIUM BROMIDE 0.03 % NA SOLN: 2.0000 | Freq: Two times a day (BID) | NASAL | 0 refills | Status: DC

## 2022-11-02 MED ORDER — PROMETHAZINE-DM 6.25-15 MG/5ML PO SYRP: 5.0000 mL | ORAL_SOLUTION | Freq: Three times a day (TID) | ORAL | 0 refills | Status: DC | PRN

## 2022-11-02 NOTE — Discharge Instructions (Addendum)
We will notify you of your test results as they arrive and may take between about 24 hours.  I encourage you to sign up for MyChart if you have not already done so as this can be the easiest way for [place] to communicate results to you online or through a phone app.  Generally, we only contact you if it is a positive test result.  In the meantime, if you develop worsening symptoms including fever, chest pain, shortness of breath despite our current treatment plan then please report to the emergency room as this may be a sign of worsening status from possible viral infection.  Otherwise, we will manage this as a viral syndrome. For sore throat or cough try using a honey-based tea. Use 3 teaspoons of honey with juice squeezed from half lemon. Place shaved pieces of ginger into 1/2-1 cup of water and warm over stove top. Then mix the ingredients and repeat every 4 hours as needed. Please take Tylenol 500mg -650mg  every 6 hours for aches and pains, fevers. Hydrate very well with at least 2 liters of water. Eat light meals such as soups to replenish electrolytes and soft fruits, veggies. Start an antihistamine like Zyrtec for postnasal drainage, sinus congestion.  You can take this together with the nose spray, Atrovent, as needed for the same kind of congestion.  Use the cough medications as needed.   If you test positive for COVID 19 we will be prescribing Paxlovid to help you against COVID.

## 2022-11-02 NOTE — ED Triage Notes (Addendum)
Pt c/o dry cough, nasal congestion, possible fever, acid reflux-sx started 2 days ago-last tylenol 500mg  at 11am-NAD-steady gait

## 2022-11-02 NOTE — ED Provider Notes (Signed)
[facility]  Note:  This document was prepared using Conservation officer, historic buildings and may include unintentional dictation errors.  MRN: [id number] DOB: 10/28/1966  Subjective:   Marvin Soto is a 56 y.o. male presenting for 2-day history of acute onset sinus congestion, fever, coughing, acid reflux symptoms.  Patient tried to wait it out but wants to make sure that he does not get really sick.  He has a history of atrial fibrillation and has been using ibuprofen, decongestants.  No smoking, history of respiratory disorders.  No current facility-administered medications for this encounter.  Current Outpatient Medications:    aspirin EC 81 MG tablet, Take 81 mg by mouth daily., Disp: , Rfl:    atorvastatin (LIPITOR) 40 MG tablet, Take 1 tablet (40 mg total) by mouth daily., Disp: 30 tablet, Rfl: 0   cetirizine (ZYRTEC) 10 MG tablet, Take 10 mg by mouth daily., Disp: , Rfl:    ibuprofen (ADVIL) 600 MG tablet, Take 1 tablet (600 mg total) by mouth 3 (three) times daily., Disp: 21 tablet, Rfl: 0   losartan (COZAAR) 50 MG tablet, Take 1 tablet (50 mg total) by mouth daily., Disp: 90 tablet, Rfl: 3   metoprolol tartrate (LOPRESSOR) 25 MG tablet, TAKE 1 TABLET(25 MG) BY MOUTH TWICE DAILY, Disp: 180 tablet, Rfl: 3   nitroGLYCERIN (NITROSTAT) 0.4 MG SL tablet, Place 1 tablet (0.4 mg total) under the tongue every 5 (five) minutes as needed for chest pain., Disp: 25 tablet, Rfl: 3   XARELTO 20 MG TABS tablet, TAKE 1 TABLET BY MOUTH DAILY WITH SUPPER, Disp: 30 tablet, Rfl: 5   No Known Allergies  Past Medical History:  Diagnosis Date   Anginal pain (HCC)    Coronary artery disease    Dyslipidemia    GERD (gastroesophageal reflux disease)    Hypertension      Past Surgical History:  Procedure Laterality Date   ANTERIOR CRUCIATE LIGAMENT REPAIR Left 1996   ATRIAL FIBRILLATION ABLATION N/A 02/16/2021   Procedure: ATRIAL FIBRILLATION ABLATION;  Surgeon: Lanier Prude, MD;  Location: [facility];  Service: Cardiovascular;  Laterality: N/A;   CARDIAC CATHETERIZATION N/A 06/10/2015   Procedure: Left Heart Cath and Coronary Angiography;  Surgeon: [name], MD;  Location: [facility];  Service: Cardiovascular;  Laterality: N/A;   CARDIAC CATHETERIZATION N/A 06/10/2015   Procedure: Coronary Stent Intervention;  Surgeon: [name], MD;  Location: [facility];  Service: Cardiovascular;  Laterality: N/A;   CORONARY ANGIOPLASTY WITH STENT PLACEMENT  06/10/2015   MASTECTOMY      Family History  Problem Relation Age of Onset   Heart attack Father    Stroke Father    Multiple sclerosis Sister    Healthy Mother    Healthy Sister    Healthy Sister     Social History   Tobacco Use   Smoking status: Never   Smokeless tobacco: Never  Substance Use Topics   Alcohol use: Yes    Alcohol/week: 3.0 standard drinks of alcohol    Types: 3 Cans of beer per week    Comment: weekly   Drug use: No    ROS   Objective:   Vitals: BP 128/78 (BP Location: Left Arm)   Pulse (!) 109   Temp 100.2 F (37.9 C) (Oral)   Resp 20   SpO2 97%   Physical Exam Constitutional:      General: He is not in acute distress.  Appearance: Normal appearance. He is well-developed. He is not ill-appearing, toxic-appearing or diaphoretic.  HENT:     Head: Normocephalic and atraumatic.     Right Ear: External ear normal.     Left Ear: External ear normal.     Nose: Nose normal.     Mouth/Throat:     Mouth: Mucous membranes are moist.  Eyes:     General: No scleral icterus.       Right eye: No discharge.        Left eye: No discharge.     Extraocular Movements: Extraocular movements intact.  Cardiovascular:     Rate and Rhythm: Normal rate and regular rhythm.     Heart sounds: Normal heart sounds. No murmur heard.    No friction rub. No gallop.  Pulmonary:     Effort: Pulmonary effort is normal. No respiratory distress.      Breath sounds: Normal breath sounds. No stridor. No wheezing, rhonchi or rales.  Neurological:     Mental Status: He is alert and oriented to person, place, and time.  Psychiatric:        Mood and Affect: Mood normal.        Behavior: Behavior normal.        Thought Content: Thought content normal.     Results for orders placed or performed during the hospital encounter of 11/02/22 (from the past 24 hour(s))  POCT rapid strep A     Status: None   Collection Time: 11/02/22  2:18 PM  Result Value Ref Range   Rapid Strep A Screen Negative Negative   DG Chest 2 View  Result Date: 11/02/2022 CLINICAL DATA:  Cough, decreased lung sounds EXAM: CHEST - 2 VIEW COMPARISON:  None Available. FINDINGS: Normal mediastinum and cardiac silhouette. Normal pulmonary vasculature. No evidence of effusion, infiltrate, or pneumothorax. No acute bony abnormality. IMPRESSION: No acute cardiopulmonary process. Electronically Signed   By: Genevive Bi M.D.   On: 11/02/2022 14:29     Assessment and Plan :   PDMP not reviewed this encounter.  1. Acute viral syndrome   2. Throat pain     Strep culture, COVID and flu testing pending.  Chest x-ray negative.  Rapid strep negative.  We will hold off on antibiotics for now.  Patient would definitely need COVID antivirals should he test positive for this.    Recommended supportive care safe given his chronic heart conditions.  Otherwise use supportive care for an acute viral syndrome. Counseled patient on potential for adverse effects with medications prescribed/recommended today, ER and return-to-clinic precautions discussed, patient verbalized understanding.    Wallis Bamberg, [place] 11/02/22 1836

## 2022-11-03 ENCOUNTER — Telehealth (HOSPITAL_COMMUNITY): Payer: Self-pay | Admitting: Emergency Medicine

## 2022-11-03 ENCOUNTER — Encounter: Payer: Self-pay | Admitting: Interventional Cardiology

## 2022-11-03 MED ORDER — MOLNUPIRAVIR EUA 200MG CAPSULE: 4.0000 | ORAL_CAPSULE | Freq: Two times a day (BID) | ORAL | 0 refills | Status: AC

## 2022-11-05 LAB — CULTURE, GROUP A STREP (THRC)

## 2023-02-12 ENCOUNTER — Encounter: Payer: Self-pay | Admitting: Cardiology

## 2023-02-14 ENCOUNTER — Telehealth: Payer: Self-pay

## 2023-02-14 DIAGNOSIS — I480 Paroxysmal atrial fibrillation: Secondary | ICD-10-CM

## 2023-02-14 MED ORDER — RIVAROXABAN 20 MG PO TABS: 20.0000 mg | ORAL_TABLET | Freq: Every day | ORAL | 5 refills | Status: DC

## 2023-02-14 NOTE — Telephone Encounter (Signed)
**Note De-Identified Jebidiah Baggerly Obfuscation** Marvin Soto (Key: [id number]) Xarelto 20MG  tablets Form Librarian, academic PA Form [phone] NCPDP) Determination Favorable Message from [place];Coverage Start Date:02/14/2023;Coverage End Date:02/14/2024;. Authorization Expiration Date: February 14, 2024.

## 2023-03-05 IMAGING — CT CT HEART MORPH/PULM VEIN W/ CM & W/O CA SCORE
2 of 7 series · 11 of 20 positions shown, 13 images · IV contrast (Omni 300)
Comparison: Chest radiograph 02/06/2020
COMPARISON: Chest radiograph 02/06/2020

Addendum:
EXAM:
OVER-READ INTERPRETATION  CT CHEST

The following report is an over-read performed by radiologist Dr.
Conio Ugo [REDACTED] on 02/11/2021. This over-read
does not include interpretation of cardiac or coronary anatomy or
pathology. The coronary CTA interpretation by the cardiologist is
attached.
CLINICAL DATA: Atrial fibrillation scheduled for an ablation.
Cardiac CT/CTA
TECHNIQUE: A 140 kV prospective scan was triggered in the ascending thoracic
aorta at 140 HU's. Gantry rotation speed was 250 msecs and
collimation was 0.6 mm. No beta blockade or nitroglycerin was given.
The 3D data set was reconstructed for best systolic and diastolic
phases along with delayed images of the AYLA. Images were analyzed on
a dedicated work station using MPR, MIP and VRT modes. The patient
received 80 cc of contrast.

[Series 11: 0-95% · axial · 0.43mm/px · z∈[+1312,+1396]mm · 5 of 2530 slices shown]
[im 422/2530  vessel]
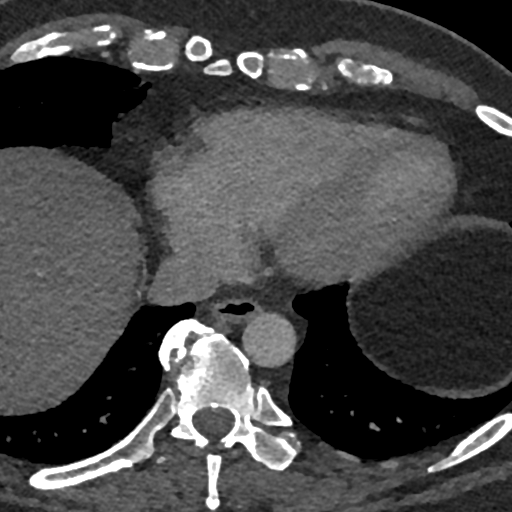
[im 844/2530  vessel]
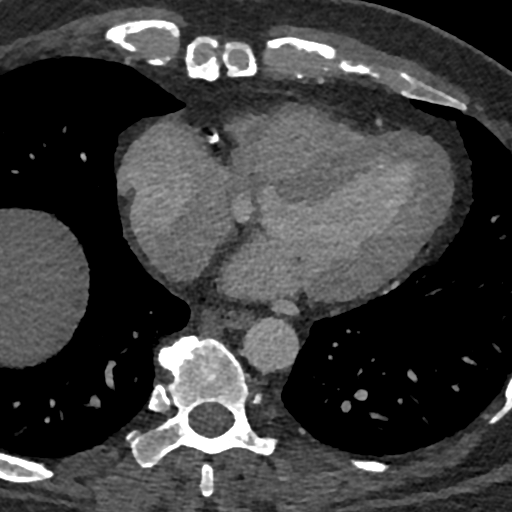
[im 1265/2530  vessel]
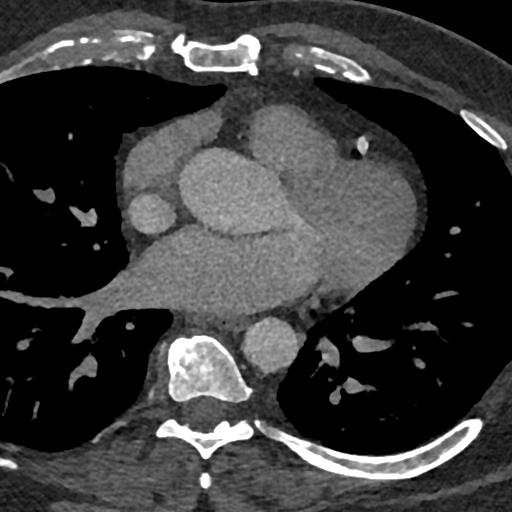
[im 1687/2530  vessel]
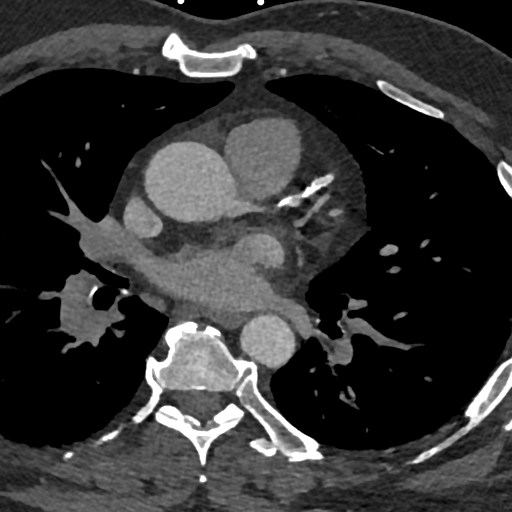
[im 2108/2530  vessel]
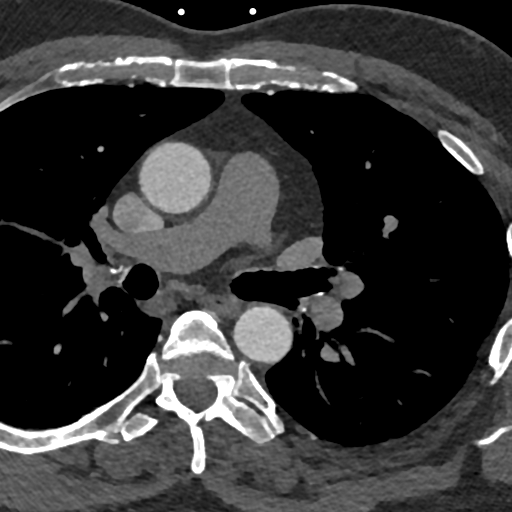

[Series 14: 5-95% · axial · 0.43mm/px · z∈[+1309,+1399]mm · 6 of 2530 slices shown, 8 images]
[im 362/2530  vessel]
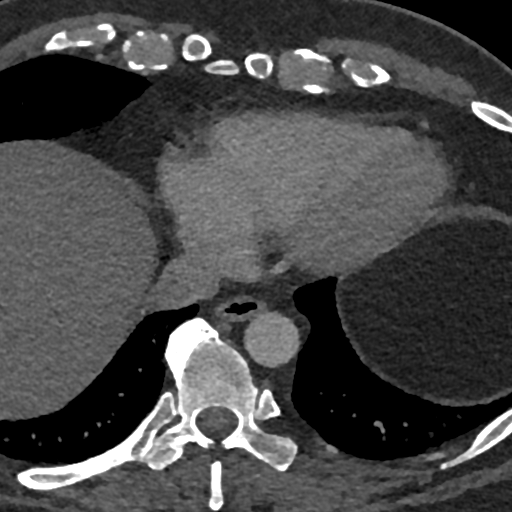
[im 362/2530  lung]
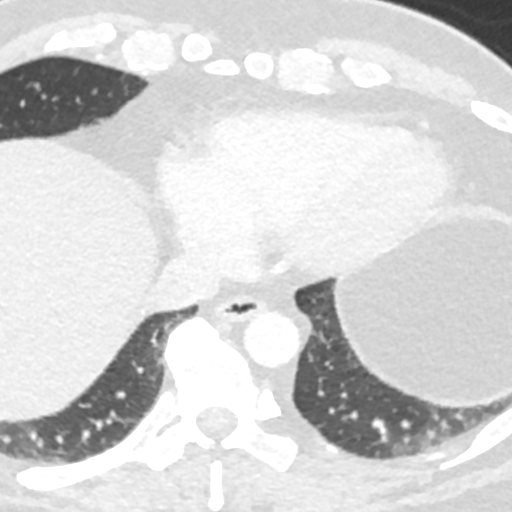
[im 723/2530  vessel]
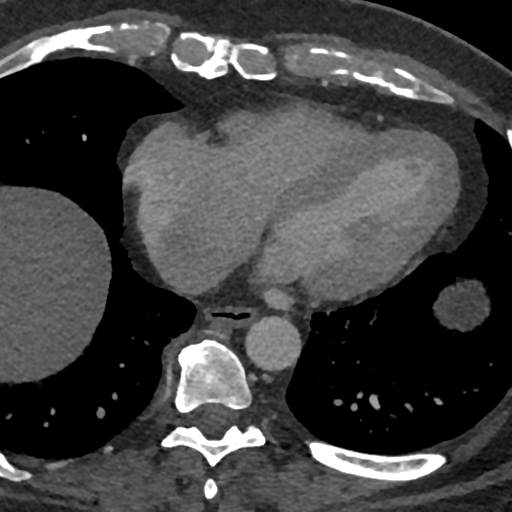
[im 1084/2530  vessel]
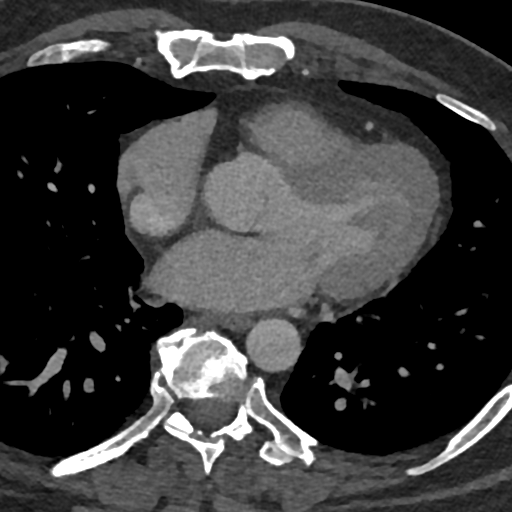
[im 1446/2530  vessel]
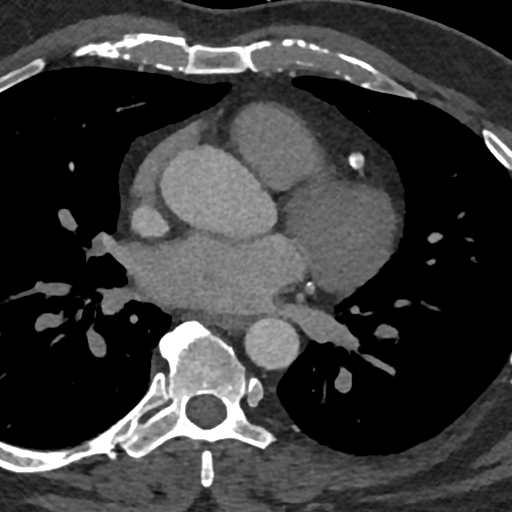
[im 1807/2530  vessel]
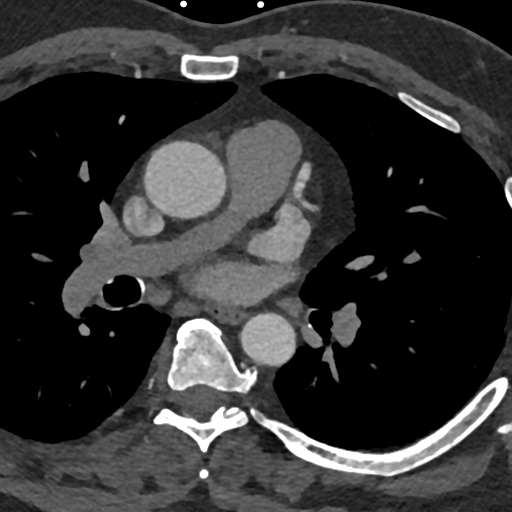
[im 1807/2530  lung]
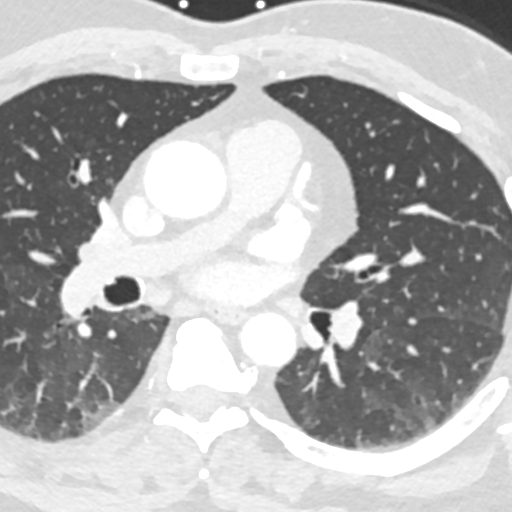
[im 2168/2530  vessel]
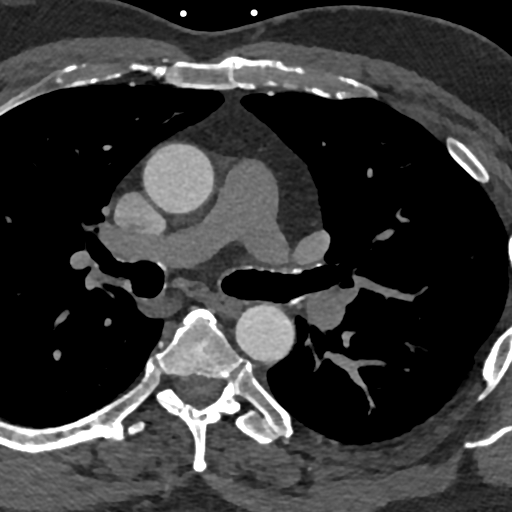

[11 of 20 positions shown; findings below may reference images not displayed]

FINDINGS: Vascular: Aortic atherosclerosis.

Mediastinum/Nodes: No imaged thoracic adenopathy.

Lungs/Pleura: No pleural fluid. Mild subsegmental atelectasis in
both lower lobes.

Upper Abdomen: Normal imaged portions of the liver, spleen, stomach.

Musculoskeletal: No acute osseous abnormality. Thoracic spondylosis.
IMPRESSION: 1.  No acute findings in the imaged extracardiac chest.
2.  Aortic Atherosclerosis (Y1C4P-DIQ.Q).
FINDINGS: Image quality: excellent.

Noise artifact is: Limited.

Pulmonary Veins: There is normal pulmonary vein drainage into the
left atrium (2 on the right and 2 on the left) with ostial
measurements as follows:

RUPV: Ostium 20 mm x 19 mm area 288 mm2

RLPV:  Ostium 18 mm x 17 mm  area 234 mm2

LUPV:  Ostium 20 mm x 13 mm area 214 mm2

LLPV:  Ostium 17 mm x 10 mm area 119 mm2

Left Atrium: The left atrial size is normal. There is no PFO/ASD.
The left atrial appendage is small broccoli type with two lobes and
ostial size 18.4 mm x 12.0 mm and length 25 mm. There is no thrombus
in the left atrial appendage on contrast or delayed imaging. The
esophagus runs in the left atrial midline and is not in proximity to
any of the pulmonary vein ostia.

Coronary Arteries: Calcium scoring not performed due to prior PCI.
Normal coronary origin. Right dominance. The study was performed
without use of NTG and is insufficient for plaque evaluation.

Right Atrium: Right atrial size is within normal limits.

Right Ventricle: The right ventricular cavity is within normal
limits.

Left Ventricle: The ventricular cavity size is within normal limits.
There are no stigmata of prior infarction. There is no abnormal
filling defect.

Pericardium: Normal thickness with no significant effusion or
calcium present.

Pulmonary Artery: Normal caliber without proximal filling defect.

Cardiac valves: The aortic valve is trileaflet without significant
calcification. The mitral valve is normal structure without
significant calcification.

Aorta: Normal caliber with no significant disease.

Extra-cardiac findings: See attached radiology report for
non-cardiac structures.
IMPRESSION: 1. There is normal pulmonary vein drainage into the left atrium with
ostial measurements above.

2. There is no thrombus in the left atrial appendage.

3. The esophagus runs in the left atrial midline and is not in
proximity to any of the pulmonary vein ostia.

4. No PFO/ASD.

5. Normal coronary origin. Right dominance.

*** End of Addendum ***
EXAM:
OVER-READ INTERPRETATION  CT CHEST

The following report is an over-read performed by radiologist Dr.
Conio Ugo [REDACTED] on 02/11/2021. This over-read
does not include interpretation of cardiac or coronary anatomy or
pathology. The coronary CTA interpretation by the cardiologist is
attached.
FINDINGS: Vascular: Aortic atherosclerosis.

Mediastinum/Nodes: No imaged thoracic adenopathy.

Lungs/Pleura: No pleural fluid. Mild subsegmental atelectasis in
both lower lobes.

Upper Abdomen: Normal imaged portions of the liver, spleen, stomach.

Musculoskeletal: No acute osseous abnormality. Thoracic spondylosis.
IMPRESSION: 1.  No acute findings in the imaged extracardiac chest.
2.  Aortic Atherosclerosis (Y1C4P-DIQ.Q).

## 2023-03-11 IMAGING — DX DG CHEST 2V
2 series · 2 of 2 positions shown · non-contrast
Comparison: None.

CLINICAL DATA: Chest pain

EXAM:
CHEST - 2 VIEW

[w chest pa]
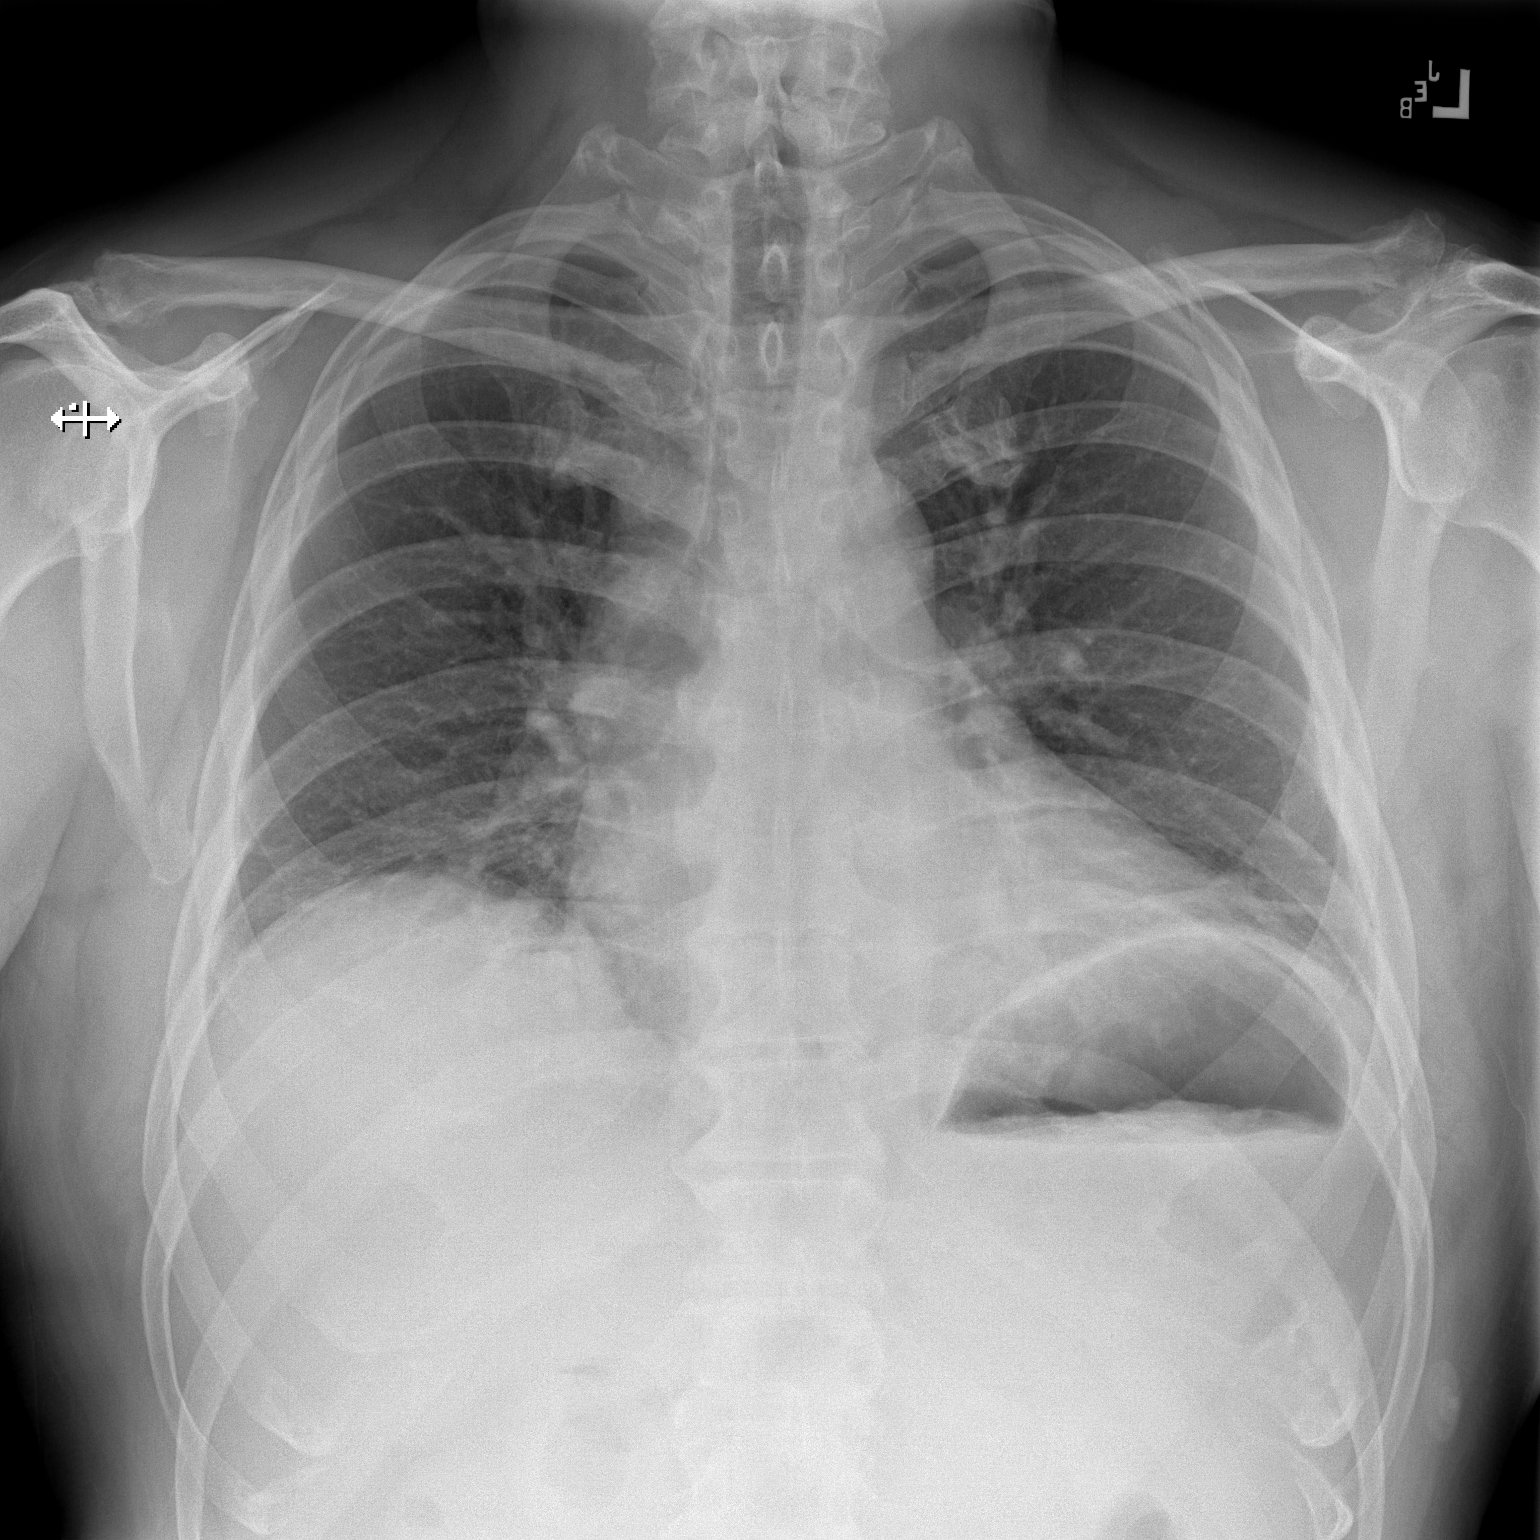

[w chest lat]
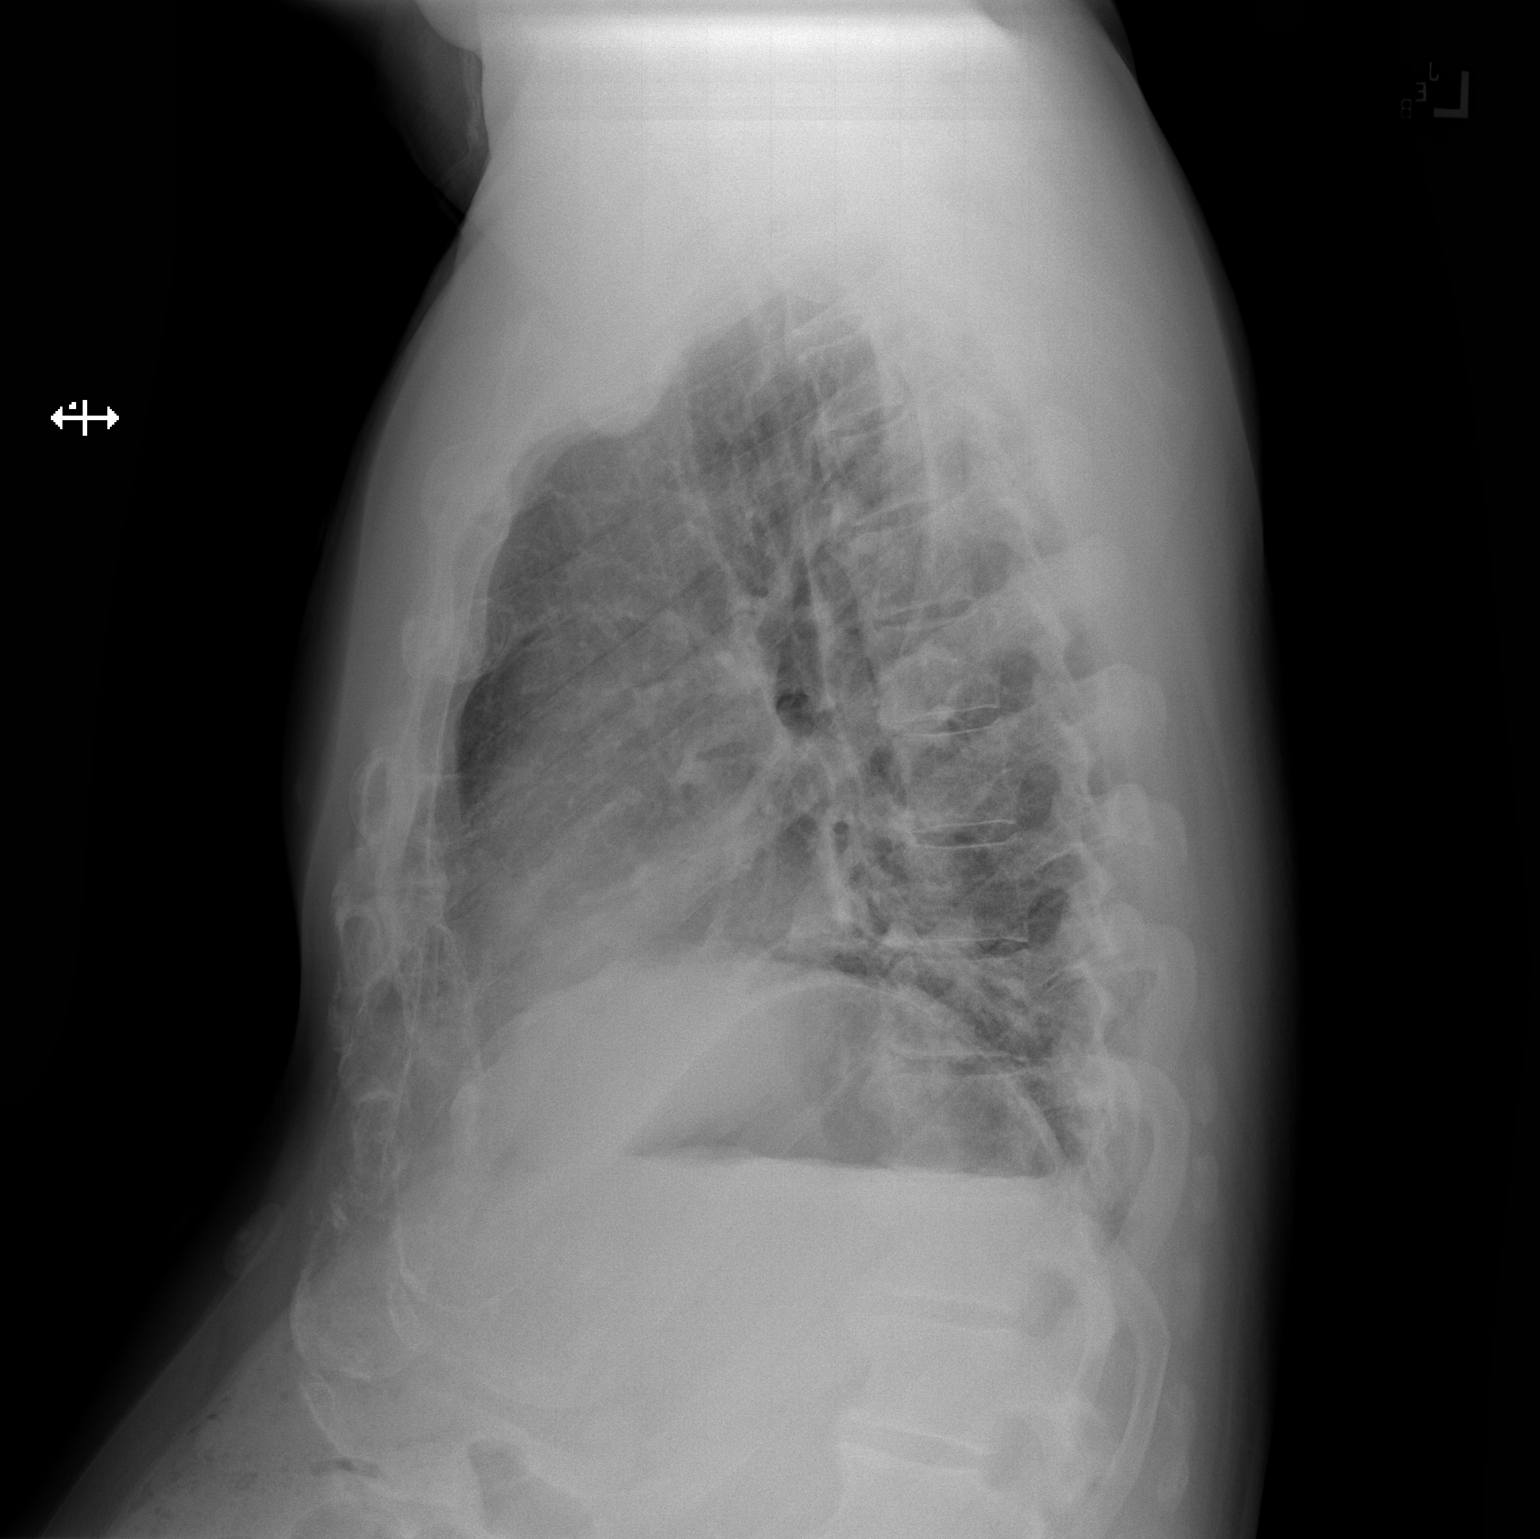

[2 of 2 positions shown; findings below may reference images not displayed]

FINDINGS: The heart size and mediastinal contours are within normal limits.
Mild hazy airspace opacity is seen at both lung bases. No large
airspace consolidation. No pleural effusion.
IMPRESSION: Mild hazy airspace opacity at both lung bases which could be due to
atelectasis or infectious etiology.

## 2023-05-13 ENCOUNTER — Other Ambulatory Visit: Payer: Self-pay | Admitting: Interventional Cardiology

## 2023-06-10 LAB — LAB REPORT - SCANNED: EGFR: 86

## 2023-06-14 ENCOUNTER — Encounter: Payer: Self-pay | Admitting: Family Medicine

## 2023-08-26 ENCOUNTER — Other Ambulatory Visit: Payer: Self-pay | Admitting: Cardiology

## 2023-08-26 DIAGNOSIS — I480 Paroxysmal atrial fibrillation: Secondary | ICD-10-CM

## 2023-08-26 NOTE — Telephone Encounter (Addendum)
Xarelto 20mg  refill request received. Pt is 57 years old, weight-96.6 kg, Crea-1.02 on 06/10/23 via [facility] from [place], last seen by Dr. Lalla Brothers on 09/21/22, Diagnosis-Afib, CrCl-109.17 mL/min; Dose is appropriate based on dosing criteria.

## 2023-09-15 NOTE — Progress Notes (Signed)
Electrophysiology Office Note:   Date:  09/16/2023  ID:  Marvin Soto, DOB [birth year], MRN [id number]  Primary Cardiologist: Lance Muss, MD Electrophysiologist: Lanier Prude, MD      History of Present Illness:   Marvin Soto is a 57 y.o. male with h/o paroxysmal AF s/p ablation 01/2021, HTN, HLD, CAD s/p DES to LAD (2016) seen today for routine electrophysiology followup.   Since last being seen in our clinic the patient reports he has been doing well overall. In regards to AF, he has had 5 episodes since January lasting minutes to longest of 6 hours.  He will stick his face in ice water then rest and this will typically break his AF.  He feels comfortable with his regimen at this point. Reports he sits at work and remains largely sedentary.   He denies chest pain, palpitations, dyspnea, PND, orthopnea, nausea, vomiting, dizziness, syncope, edema, weight gain, or early satiety.   Review of systems complete and found to be negative unless listed in HPI.   EP Information / Studies Reviewed:    EKG is ordered today. Personal review as below.  EKG Interpretation Date/Time:  Friday September 16 2023 10:09:10 EDT Ventricular Rate:  98 PR Interval:  148 QRS Duration:  70 QT Interval:  322 QTC Calculation: 411 R Axis:   15  Text Interpretation: Normal sinus rhythm Confirmed by Canary Brim ([id number]) on 09/16/2023 10:22:54 AM   Studies:  ECHO 2021 > LVEF 60-65%, no RWMA, trivial MVR, no MV stenosis, trivial AVR, no AV stenosis  Cardiac CT 01/2021 > normal pulmonary vein drainage, no thrombus in LAA, no PFO/ASD, normal coronary orgin Exercise Stress Test 05/2021 > negative for stress induced arrhythmias, ischemia     Arrhythmia / AAD AF s/p ablation 01/2021 Multaq > stopped 04/2021 Xarelto for stroke prevention    Risk Assessment/Calculations:    CHA2DS2-VASc Score = 2   This indicates a 2.2% annual risk of stroke. The patient's score is based upon: CHF History: 0 HTN  History: 1 Diabetes History: 0 Stroke History: 0 Vascular Disease History: 1 Age Score: 0 Gender Score: 0             Physical Exam:   VS:  BP 114/80   Pulse 92   Ht 5\' 11"  (1.803 m)   Wt 215 lb 3.2 oz (97.6 kg)   SpO2 97%   BMI 30.01 kg/m    Wt Readings from Last 3 Encounters:  09/16/23 215 lb 3.2 oz (97.6 kg)  09/21/22 213 lb (96.6 kg)  08/19/22 213 lb (96.6 kg)     GEN: Well nourished, well developed in no acute distress NECK: No JVD; No carotid bruits CARDIAC: Regular rate and rhythm, no murmurs, rubs, gallops RESPIRATORY:  Clear to auscultation without rales, wheezing or rhonchi  ABDOMEN: Soft, non-tender, non-distended EXTREMITIES:  No edema; No deformity   ASSESSMENT AND PLAN:    Paroxysmal Atrial Fibrillation  Secondary Hypercoagulable State  S/p ablation 01/2021, CHA2DS2-VASc 2 -continue xarelto, dose reviewed appropriate for Cr Cl (110 mL/min) -BMP 05/2023 at [facility] with Cr 1.02  -continue metoprolol  -discussed medications for AF in future > could revisit Multaq or Tikosyn vs repeat ablation. Pt is not bothered by his current burden of AF and at this time does not want to pursue further interventions. Discussed if increased burden, to reach out to clinic.   CAD s/p DES  HLD  -ASA, statin  HTN  -well controlled on current regimen  -discussed scheduling with new  primary Cardiology at this visit, last seen by Dr. Eldridge Dace in 07/2022  Follow up with Dr. Lalla Brothers in 12 months  Signed, Canary Brim, MSN, APRN, NP-C, AGACNP-BC [facility] - Electrophysiology  09/16/2023, 10:49 AM

## 2023-09-16 ENCOUNTER — Encounter: Payer: Self-pay | Admitting: Pulmonary Disease

## 2023-09-16 ENCOUNTER — Ambulatory Visit: Payer: Managed Care, Other (non HMO) | Attending: Student | Admitting: Pulmonary Disease

## 2023-09-16 VITALS — BP 114/80 | HR 92 | Ht 71.0 in | Wt 215.2 lb

## 2023-09-16 DIAGNOSIS — E782 Mixed hyperlipidemia: Secondary | ICD-10-CM | POA: Diagnosis not present

## 2023-09-16 DIAGNOSIS — Z9861 Coronary angioplasty status: Secondary | ICD-10-CM

## 2023-09-16 DIAGNOSIS — I10 Essential (primary) hypertension: Secondary | ICD-10-CM | POA: Diagnosis not present

## 2023-09-16 DIAGNOSIS — I2510 Atherosclerotic heart disease of native coronary artery without angina pectoris: Secondary | ICD-10-CM | POA: Diagnosis not present

## 2023-09-16 DIAGNOSIS — I480 Paroxysmal atrial fibrillation: Secondary | ICD-10-CM | POA: Diagnosis not present

## 2023-09-16 NOTE — Patient Instructions (Addendum)

## 2023-09-22 ENCOUNTER — Other Ambulatory Visit: Payer: Self-pay | Admitting: Interventional Cardiology

## 2023-09-23 ENCOUNTER — Encounter: Payer: Self-pay | Admitting: Cardiology

## 2023-10-05 ENCOUNTER — Ambulatory Visit: Payer: Managed Care, Other (non HMO) | Attending: Cardiovascular Disease | Admitting: Cardiovascular Disease

## 2023-10-05 ENCOUNTER — Encounter: Payer: Self-pay | Admitting: Cardiovascular Disease

## 2023-10-05 VITALS — BP 120/82 | HR 86 | Ht 71.0 in | Wt 218.4 lb

## 2023-10-05 DIAGNOSIS — I10 Essential (primary) hypertension: Secondary | ICD-10-CM

## 2023-10-05 DIAGNOSIS — E782 Mixed hyperlipidemia: Secondary | ICD-10-CM | POA: Diagnosis not present

## 2023-10-05 DIAGNOSIS — I25119 Atherosclerotic heart disease of native coronary artery with unspecified angina pectoris: Secondary | ICD-10-CM

## 2023-10-05 DIAGNOSIS — I480 Paroxysmal atrial fibrillation: Secondary | ICD-10-CM | POA: Diagnosis not present

## 2023-10-05 NOTE — Assessment & Plan Note (Signed)
Blood pressure is well controlled on a combination of losartan and metoprolol.

## 2023-10-05 NOTE — Assessment & Plan Note (Signed)
Lipids are at goal on atorvastatin 40 mg daily.  Cholesterol is 110, LDL 51.  Continue current therapy.

## 2023-10-05 NOTE — Assessment & Plan Note (Signed)
Occasional symptoms of atrial fibrillation post-ablation. Recently seen by EP and notes reviewed. Plan to continue current Rx. Remains on rivaroxaban with CHADS-Vasc of 2.

## 2023-10-05 NOTE — Assessment & Plan Note (Addendum)
The patient will continue on aspirin and a high intensity statin drug.  His angina is controlled on metoprolol.  He has occasional chest discomfort when he converts from atrial fibrillation to sinus rhythm.  He is not having exertional angina like that when he had occlusive coronary disease back in 2016.  Will continue with his medical management.  I will see him back in 1 year and he will call if any problems arise.

## 2023-10-05 NOTE — Patient Instructions (Signed)
Medication Instructions:  Your physician recommends that you continue on your current medications as directed. Please refer to the Current Medication list given to you today.  *If you need a refill on your cardiac medications before your next appointment, please call your pharmacy*   Lab Work: None.  If you have labs (blood work) drawn today and your tests are completely normal, you will receive your results only by: MyChart Message (if you have MyChart) OR A paper copy in the mail If you have any lab test that is abnormal or we need to change your treatment, we will call you to review the results.   Testing/Procedures: None.   Follow-Up:   Your next appointment:   1 year(s)  Provider:   Dr. Tonny Bollman, MD

## 2023-10-05 NOTE — Progress Notes (Signed)
Cardiology Office Note:    Date:  10/05/2023   ID:  Marvin Soto, DOB July 31, 1966, MRN [id number]  PCP:  Marvin Joe, MD    HeartCare Providers Cardiologist:  Marvin Muss, MD Electrophysiologist:  Marvin Prude, MD     Referring MD: Marvin Joe, MD   Chief Complaint  Patient presents with   Coronary Artery Disease    History of Present Illness:    Marvin Soto is a 57 y.o. male presenting for follow-up of CAD. He has been followed by Dr Marvin Soto and I will be assuming his general cardiology care moving forward. He is also followed by Dr Marvin Soto for atrial fibrillation. The patient underwent stenting of the LAD in 2016 after an abnormal stress test. His last stress myoview test in 2022 showed no ischemia.   The patient is here alone today. We reviewed his coronary history. He was having exertional angina prior to his stent procedure. This has only recurred once since undergoing PCI and it occurred with trying to start a chainsaw. Otherwise he has had no exertional chest pain or pressure. He reports that he is doing well and has no exertional symptoms at present. He is walking for exercise. He reports 5 episodes of tachypalpitations this year and these have been self-limited.    Current Medications: Current Meds  Medication Sig   acetaminophen (TYLENOL) 325 MG tablet Take 650 mg by mouth every 6 (six) hours as needed.   aspirin EC 81 MG tablet Take 81 mg by mouth daily.   atorvastatin (LIPITOR) 40 MG tablet TAKE 1 TABLET(40 MG) BY MOUTH DAILY   cetirizine (ZYRTEC) 10 MG tablet Take 10 mg by mouth daily.   losartan (COZAAR) 50 MG tablet Take 1 tablet (50 mg total) by mouth daily.   metoprolol tartrate (LOPRESSOR) 25 MG tablet TAKE 1 TABLET(25 MG) BY MOUTH TWICE DAILY   XARELTO 20 MG TABS tablet TAKE 1 TABLET(20 MG) BY MOUTH DAILY WITH SUPPER     Allergies:   Patient has no known allergies.   ROS:   Please see the history of present illness.    All other  systems reviewed and are negative.  EKGs/Labs/Other Studies Reviewed:    The following studies were reviewed today: Cardiac Studies & Procedures   CARDIAC CATHETERIZATION  CARDIAC CATHETERIZATION 06/10/2015  Narrative  Mid LAD-2 lesion, 95% stenosed. A 4.0 x 22 resolute drug-eluting stent was deployed. There is a 0% residual stenosis post intervention.  The left ventricular systolic function is normal.  Continue dual antiplatelets therapy for at least a year without interruption. He'll need aggressive secondary prevention. Will change his proton pump inhibitor to protonic due to the Plavix that he is on. Stop Imdur since his angina will likely be resolved with this intervention. He has other mild coronary disease. Try to keep blood pressure under control and LDL below 100. Dietary modifications as well. He'll be watched overnight.  Findings Coronary Findings Diagnostic  Dominance: Co-dominant  Left Anterior Descending The vessel is large .  First Diagonal Branch  Second Diagonal Branch The vessel is small in size.  Third Diagonal Branch The vessel is small in size.  Left Circumflex  First Obtuse Marginal Branch The vessel is small in size.  Right Coronary Artery  Intervention  Mid LAD-2 lesion PCI The pre-interventional distal flow is normal (TIMI 3). Pre-stent angioplasty was performed. A drug-eluting stent was placed. The strut is apposed. Post-stent angioplasty was performed. Maximum pressure: 18 atm. The post-interventional distal flow is normal (  TIMI 3). The intervention was successful. No complications occurred at this lesion. Supplies used: STENT RESOLUTE INTEG 4.0X22; BALLN Adams TREK RX 4.0X12 There is a 0% residual stenosis post intervention.   STRESS TESTS  MYOCARDIAL PERFUSION IMAGING 06/08/2021  Narrative  The left ventricular ejection fraction is hyperdynamic (>65%).  Nuclear stress EF: 67%.  There was no ST segment deviation noted during stress.   No T wave inversion was noted during stress.  This is a low risk study.  The study is normal.  IMPRESSIONS Negative for stress induced arrhythmias. No evidence of ischemic or infarction.  RECOMMENDATIONS/CONCLUSIONS Stress test is negative. The study is consistent with a low risk study.   ECHOCARDIOGRAM  ECHOCARDIOGRAM COMPLETE 04/16/2020  Narrative ECHOCARDIOGRAM REPORT    Patient Name:   Marvin Soto  Date of Exam: 04/16/2020 Medical Rec #:  [id number]     Height:       72.0 in Accession #:    [id number]    Weight:       217.0 lb Date of Birth:  January 01, 1966     BSA:          2.206 m Patient Age:    54 years      BP:           122/78 mmHg Patient Gender: M             HR:           64 bpm. Exam Location:  [address]  Procedure: 2D Echo, Cardiac Doppler and Color Doppler  Indications:    I48.0  History:        Patient has no prior history of Echocardiogram examinations. CAD, Arrythmias:Atrial Fibrillation; Risk Factors:Hypertension and Dyslipidemia.  Sonographer:    Samule Ohm RDCS Referring Phys: [phone] Marvin Soto  IMPRESSIONS   1. Left ventricular ejection fraction, by estimation, is 60 to 65%. The left ventricle has normal function. The left ventricle has no regional wall motion abnormalities. There is mild concentric left ventricular hypertrophy. Left ventricular diastolic parameters were normal. 2. Right ventricular systolic function is normal. The right ventricular size is normal. There is normal pulmonary artery systolic pressure. 3. The mitral valve is normal in structure. Trivial mitral valve regurgitation. No evidence of mitral stenosis. 4. The aortic valve is tricuspid. Aortic valve regurgitation is trivial. No aortic stenosis is present. 5. The inferior vena cava is normal in size with greater than 50% respiratory variability, suggesting right atrial pressure of 3 mmHg.  FINDINGS Left Ventricle: Left ventricular ejection fraction, by estimation,  is 60 to 65%. The left ventricle has normal function. The left ventricle has no regional wall motion abnormalities. The left ventricular internal cavity size was normal in size. There is mild concentric left ventricular hypertrophy. Left ventricular diastolic parameters were normal. Indeterminate filling pressures.  Right Ventricle: The right ventricular size is normal. No increase in right ventricular wall thickness. Right ventricular systolic function is normal. There is normal pulmonary artery systolic pressure. The tricuspid regurgitant velocity is 2.28 m/s, and with an assumed right atrial pressure of 3 mmHg, the estimated right ventricular systolic pressure is 23.8 mmHg.  Left Atrium: Left atrial size was normal in size.  Right Atrium: Right atrial size was normal in size.  Pericardium: Trivial pericardial effusion is present.  Mitral Valve: The mitral valve is normal in structure. Normal mobility of the mitral valve leaflets. Trivial mitral valve regurgitation. No evidence of mitral valve stenosis.  Tricuspid Valve: The tricuspid valve is normal  in structure. Tricuspid valve regurgitation is trivial. No evidence of tricuspid stenosis.  Aortic Valve: The aortic valve is tricuspid. Aortic valve regurgitation is trivial. No aortic stenosis is present.  Pulmonic Valve: The pulmonic valve was normal in structure. Pulmonic valve regurgitation is not visualized. No evidence of pulmonic stenosis.  Aorta: The aortic root is normal in size and structure.  Venous: The inferior vena cava is normal in size with greater than 50% respiratory variability, suggesting right atrial pressure of 3 mmHg.  IAS/Shunts: No atrial level shunt detected by color flow Doppler.   LEFT VENTRICLE PLAX 2D LVIDd:         4.50 cm  Diastology LVIDs:         3.00 cm  LV e' lateral:   10.80 cm/s LV PW:         1.10 cm  LV E/e' lateral: 8.8 LV IVS:        1.10 cm  LV e' medial:    9.03 cm/s LVOT diam:     2.20 cm   LV E/e' medial:  10.5 LV SV:         74 LV SV Index:   33 LVOT Area:     3.80 cm   RIGHT VENTRICLE             IVC RV S prime:     14.30 cm/s  IVC diam: 1.30 cm TAPSE (M-mode): 2.3 cm RVSP:           23.8 mmHg  LEFT ATRIUM             Index       RIGHT ATRIUM           Index LA diam:        3.20 cm 1.45 cm/m  RA Pressure: 3.00 mmHg LA Vol (A2C):   53.8 ml 24.39 ml/m RA Area:     10.80 cm LA Vol (A4C):   28.7 ml 13.01 ml/m RA Volume:   26.60 ml  12.06 ml/m LA Biplane Vol: 39.6 ml 17.95 ml/m AORTIC VALVE LVOT Vmax:   95.00 cm/s LVOT Vmean:  58.500 cm/s LVOT VTI:    0.194 m  AORTA Ao Root diam: 3.40 cm Ao Asc diam:  3.30 cm  MV E velocity: 95.20 cm/s  TRICUSPID VALVE MV A velocity: 67.80 cm/s  TR Peak grad:   20.8 mmHg MV E/A ratio:  1.40        TR Vmax:        228.00 cm/s Estimated RAP:  3.00 mmHg RVSP:           23.8 mmHg  SHUNTS Systemic VTI:  0.19 m Systemic Diam: 2.20 cm  Chilton Si MD Electronically signed by Chilton Si MD Signature Date/Time: 04/16/2020/12:08:38 PM    Final             EKG:        Recent Labs: No results found for requested labs within last 365 days.  Recent Lipid Panel    Component Value Date/Time   CHOL 151 01/29/2020 1031   TRIG 134 01/29/2020 1031   HDL 36 (L) 01/29/2020 1031   CHOLHDL 4.2 01/29/2020 1031   CHOLHDL 3.4 02/23/2016 0802   VLDL 13 02/23/2016 0802   LDLCALC 91 01/29/2020 1031     Risk Assessment/Calculations:                Physical Exam:    VS:  BP 120/82   Pulse 86  Ht 5\' 11"  (1.803 m)   Wt 218 lb 6.4 oz (99.1 kg)   SpO2 98%   BMI 30.46 kg/m     Wt Readings from Last 3 Encounters:  10/05/23 218 lb 6.4 oz (99.1 kg)  09/16/23 215 lb 3.2 oz (97.6 kg)  09/21/22 213 lb (96.6 kg)     GEN:  Well nourished, well developed in no acute distress HEENT: Normal NECK: No JVD; No carotid bruits LYMPHATICS: No lymphadenopathy CARDIAC: RRR, no murmurs, rubs, gallops RESPIRATORY:  Clear  to auscultation without rales, wheezing or rhonchi  ABDOMEN: Soft, non-tender, non-distended MUSCULOSKELETAL:  No edema; No deformity  SKIN: Warm and dry NEUROLOGIC:  Alert and oriented x 3 PSYCHIATRIC:  Normal affect   Assessment & Plan Coronary artery disease involving native coronary artery of native heart with angina pectoris (HCC) The patient will continue on aspirin and a high intensity statin drug.  His angina is controlled on metoprolol.  He has occasional chest discomfort when he converts from atrial fibrillation to sinus rhythm.  He is not having exertional angina like that when he had occlusive coronary disease back in 2016.  Will continue with his medical management.  I will see him back in 1 year and he will call if any problems arise. Essential hypertension Blood pressure is well-controlled on a combination of losartan and metoprolol.  Paroxysmal atrial fibrillation (HCC) Occasional symptoms of atrial fibrillation post-ablation. Recently seen by EP and notes reviewed. Plan to continue current Rx. Remains on rivaroxaban with CHADS-Vasc of 2.  Mixed hyperlipidemia Lipids are at goal on atorvastatin 40 mg daily.  Cholesterol is 110, LDL 51.  Continue current therapy.       Medication Adjustments/Labs and Tests Ordered: Current medicines are reviewed at length with the patient today.  Concerns regarding medicines are outlined above.  No orders of the defined types were placed in this encounter.  No orders of the defined types were placed in this encounter.   Patient Instructions  Medication Instructions:  Your physician recommends that you continue on your current medications as directed. Please refer to the Current Medication list given to you today.  *If you need a refill on your cardiac medications before your next appointment, please call your pharmacy*   Lab Work: None.  If you have labs (blood work) drawn today and your tests are completely normal, you will  receive your results only by: MyChart Message (if you have MyChart) OR A paper copy in the mail If you have any lab test that is abnormal or we need to change your treatment, we will call you to review the results.   Testing/Procedures: None.   Follow-Up:   Your next appointment:   1 year(s)  Provider:   Dr. Tonny Bollman, MD     Signed, Tonny Bollman, MD  10/05/2023 12:37 PM    [facility]

## 2023-12-24 ENCOUNTER — Other Ambulatory Visit: Payer: Self-pay | Admitting: Cardiology

## 2024-01-30 ENCOUNTER — Encounter: Payer: Self-pay | Admitting: Cardiovascular Disease

## 2024-02-01 ENCOUNTER — Other Ambulatory Visit (HOSPITAL_COMMUNITY): Payer: Self-pay

## 2024-02-23 ENCOUNTER — Other Ambulatory Visit: Payer: Self-pay | Admitting: Cardiology

## 2024-02-23 DIAGNOSIS — I480 Paroxysmal atrial fibrillation: Secondary | ICD-10-CM

## 2024-02-24 NOTE — Telephone Encounter (Signed)
 Xarelto 20mg  refill request received. Pt is 58 years old, weight-99.1kg, Crea-1.02 on 06/10/23 via [facility] from [place], last seen by Dr. Excell Seltzer on 10/05/23, Diagnosis-Afib, CrCl-112 mL/min; Dose is appropriate based on dosing criteria.

## 2024-07-10 ENCOUNTER — Encounter: Payer: Self-pay | Admitting: Cardiovascular Disease

## 2024-08-13 ENCOUNTER — Telehealth: Payer: Self-pay | Admitting: Cardiovascular Disease

## 2024-08-13 ENCOUNTER — Encounter: Payer: Self-pay | Admitting: Cardiovascular Disease

## 2024-08-13 NOTE — Telephone Encounter (Signed)
 Spoke with pt who stated he has been having some dizziness today. The dizziness started about 8:30 AM this morning. Pt stated the dizziness is constant. Pt stated his blood pressure is 133/88 and his heart rate is 79. Pt stated his blood pressure is usually consistently 125/80. Pt was asked if he is drinking enough fluids. Pt stated he probably isn't drinking enough. Pt denies any chest pain or shortness of breath. Pt also denies feeling an irregular heart rate. Pt was told to follow up with his PCP regarding the dizziness and to drink more fluids but that the information provided would be sent to Dr. Wonda for review. Pt verbalized understanding. All questions if any were answered.

## 2024-08-13 NOTE — Telephone Encounter (Signed)
 STAT if patient feels like he/she is going to faint   Are you dizzy, lightheaded, or faint now? Slight dizziness/fogness   Have you passed out? No   Do you have any other symptoms? no  Have you checked your HR and BP (record if available)? 133/88 HR 79

## 2024-08-14 LAB — LAB REPORT - SCANNED: EGFR: 89

## 2024-08-16 NOTE — Telephone Encounter (Signed)
 Agree. Symptoms don't sound cardiac-related. thx

## 2024-08-17 NOTE — Telephone Encounter (Signed)
 Spoke with pt who states he did get an appt with PCP and feels much better. Pt stated PCP checked orthostatic VS and they were negative. Pt advised to reach out to our office with any questions or concerns.

## 2024-08-30 ENCOUNTER — Other Ambulatory Visit: Payer: Self-pay | Admitting: Cardiology

## 2024-08-30 DIAGNOSIS — I480 Paroxysmal atrial fibrillation: Secondary | ICD-10-CM

## 2024-08-31 NOTE — Telephone Encounter (Signed)
 Prescription refill request for Xarelto  received.  Indication:afib Last office visit:11/24 Weight:99.1  kg Age:58 Scr:0.98  9/25 CrCl:115.17  ml/min  Prescription refilled

## 2024-09-27 ENCOUNTER — Encounter: Payer: Self-pay | Admitting: Cardiology

## 2024-09-27 ENCOUNTER — Ambulatory Visit: Attending: Internal Medicine | Admitting: Cardiology

## 2024-09-27 VITALS — BP 119/80 | HR 84 | Ht 71.0 in | Wt 222.0 lb

## 2024-09-27 DIAGNOSIS — I25119 Atherosclerotic heart disease of native coronary artery with unspecified angina pectoris: Secondary | ICD-10-CM | POA: Diagnosis not present

## 2024-09-27 DIAGNOSIS — I480 Paroxysmal atrial fibrillation: Secondary | ICD-10-CM | POA: Diagnosis not present

## 2024-09-27 NOTE — Patient Instructions (Signed)
 Medication Instructions:  Your physician recommends that you continue on your current medications as directed. Please refer to the Current Medication list given to you today.  *If you need a refill on your cardiac medications before your next appointment, please call your pharmacy*  Follow-Up: At [facility], you and your health needs are our priority.  As part of our continuing mission to provide you with exceptional heart care, our providers are all part of one team.  This team includes your primary Cardiologist (physician) and Advanced Practice Providers or APPs (Physician Assistants and Nurse Practitioners) who all work together to provide you with the care you need, when you need it.  Your next appointment:   1 year  Provider:   Ardeen Kohler, MD

## 2024-09-27 NOTE — Progress Notes (Signed)
  Electrophysiology Office Follow up Visit Note:    Date:  09/27/2024   ID:  Marvin Soto, DOB 1966/06/13, MRN [id number]  PCP:  Seabron Lenis, MD  [facility] Cardiologist:  Candyce Reek, MD  [facility] Electrophysiologist:  OLE ONEIDA HOLTS, MD    Interval History:     Marvin Soto is a 58 y.o. male who presents for a follow up visit.   The patient was last seen by [facility] 16, 2023.  He has a history of atrial fibrillation with a catheter ablation in 2022.  Also has a history of hypertension, coronary artery disease with prior stent.  He follows with Dr. Wonda.  He takes Xarelto  for stroke prophylaxis.  At the last appointment with Daphne he reported a low burden of arrhythmia.  He is doing well today.  He reports 4-5 episodes of atrial fibrillation during the last year.  Episodes typically are short with the longest episode lasting about 6 hours.  His rates are rapid, 180 to 190 bpm.  He feels palpitations during the episode.  Sometimes sticking his face and a ice bath will get him to convert back to sinus rhythm.      Past medical, surgical, social and family history were reviewed.  ROS:   Please see the history of present illness.    All other systems reviewed and are negative.  EKGs/Labs/Other Studies Reviewed:    The following studies were reviewed today:          Physical Exam:    VS:  BP 119/80 (BP Location: Left Arm, Patient Position: Sitting, Cuff Size: Large)   Pulse 84   Ht 5' 11 (1.803 m)   Wt 222 lb (100.7 kg)   SpO2 97%   BMI 30.96 kg/m     Wt Readings from Last 3 Encounters:  09/27/24 222 lb (100.7 kg)  10/05/23 218 lb 6.4 oz (99.1 kg)  09/16/23 215 lb 3.2 oz (97.6 kg)     GEN: no distress CARD: RRR, No MRG RESP: No IWOB. CTAB.      ASSESSMENT:    1. Paroxysmal atrial fibrillation (HCC)   2. Coronary artery disease involving native coronary artery of native heart with angina pectoris    PLAN:    In order of  problems listed above:  #Paroxysmal atrial fibrillation On Xarelto  for stroke prophylaxis Low burden of arrhythmia  #Coronary artery disease No ischemic symptoms Continue aspirin  and statin  I discussed my upcoming departure from [facility] during today's clinic appointment.  The patient will continue to follow-up with one of my EP partners moving forward.  Follow-up 1 year with APP   Signed, Ole Holts, MD, [facility], [facility] 09/27/2024 3:31 PM    Electrophysiology [facility]

## 2024-10-09 ENCOUNTER — Encounter: Payer: Self-pay | Admitting: Cardiology

## 2024-10-10 MED ORDER — METOPROLOL TARTRATE 25 MG PO TABS: 25.0000 mg | ORAL_TABLET | Freq: Two times a day (BID) | ORAL | 3 refills | Status: AC

## 2024-11-06 ENCOUNTER — Other Ambulatory Visit: Payer: Self-pay | Admitting: *Deleted

## 2024-11-06 ENCOUNTER — Encounter: Payer: Self-pay | Admitting: Cardiovascular Disease

## 2024-11-06 ENCOUNTER — Ambulatory Visit: Attending: Cardiovascular Disease | Admitting: Cardiovascular Disease

## 2024-11-06 VITALS — BP 116/74 | HR 79 | Ht 71.0 in | Wt 226.8 lb

## 2024-11-06 DIAGNOSIS — I25119 Atherosclerotic heart disease of native coronary artery with unspecified angina pectoris: Secondary | ICD-10-CM

## 2024-11-06 DIAGNOSIS — Z79899 Other long term (current) drug therapy: Secondary | ICD-10-CM

## 2024-11-06 DIAGNOSIS — E782 Mixed hyperlipidemia: Secondary | ICD-10-CM

## 2024-11-06 DIAGNOSIS — I10 Essential (primary) hypertension: Secondary | ICD-10-CM

## 2024-11-06 DIAGNOSIS — I2583 Coronary atherosclerosis due to lipid rich plaque: Secondary | ICD-10-CM

## 2024-11-06 DIAGNOSIS — I2510 Atherosclerotic heart disease of native coronary artery without angina pectoris: Secondary | ICD-10-CM

## 2024-11-06 DIAGNOSIS — I480 Paroxysmal atrial fibrillation: Secondary | ICD-10-CM

## 2024-11-06 MED ORDER — EZETIMIBE 10 MG PO TABS: 10.0000 mg | ORAL_TABLET | Freq: Every day | ORAL | 3 refills | Status: AC

## 2024-11-06 NOTE — Assessment & Plan Note (Addendum)
 Continue rivaroxaban . Stop ASA. Follow-up EP one year as planned.

## 2024-11-06 NOTE — Progress Notes (Signed)
 Cardiology Office Note:    Date:  11/06/2024   ID:  Marvin Soto, DOB 02/01/1966, MRN [id number]  PCP:  Marvin Lenis, MD   [facility] Providers Cardiologist:  Marvin Fell, MD Electrophysiologist:  Marvin ONEIDA HOLTS, MD     Referring MD: Marvin Lenis, MD   Chief Complaint  Patient presents with   Atrial Fibrillation    History of Present Illness:    Marvin Soto is a 58 y.o. male with a hx of coronary artery disease, presenting for follow-up evaluation.  I saw him last 1 year ago.  He has also been followed by EP for the presence of atrial fibrillation.  The patient underwent LAD stenting in 2016 after an abnormal stress test and he had a normal nuclear perfusion scan in 2022.  He has undergone atrial fibrillation ablation.  He has been anticoagulated with rivaroxaban .  He is here alone today. He continues to have 4-5 episodes of atrial fibrillation with fast heart rates each year. He sometimes has chest pain when he is 'coming out of afib.' However, he denies any discomfort with physical exertion. No exertional dyspnea, lightheadedness, or syncope.    Current Medications: Current Meds  Medication Sig   acetaminophen  (TYLENOL ) 325 MG tablet Take 650 mg by mouth every 6 (six) hours as needed.   atorvastatin  (LIPITOR) 40 MG tablet TAKE ONE TABLET BY MOUTH DAILY   cetirizine (ZYRTEC) 10 MG tablet Take 10 mg by mouth daily.   ezetimibe  (ZETIA ) 10 MG tablet Take 1 tablet (10 mg total) by mouth daily.   losartan  (COZAAR ) 50 MG tablet Take 1 tablet (50 mg total) by mouth daily.   metoprolol  tartrate (LOPRESSOR ) 25 MG tablet Take 1 tablet (25 mg total) by mouth 2 (two) times daily.   nitroGLYCERIN  (NITROSTAT ) 0.4 MG SL tablet Place 1 tablet (0.4 mg total) under the tongue every 5 (five) minutes as needed for chest pain.   rivaroxaban  (XARELTO ) 20 MG TABS tablet TAKE ONE TABLET BY MOUTH DAILY WITH SUPPER   sildenafil (REVATIO) 20 MG tablet Take 20 mg by mouth as needed.    [DISCONTINUED] aspirin  EC 81 MG tablet Take 81 mg by mouth daily.     Allergies:   Patient has no known allergies.   ROS:   Please see the history of present illness.     All other systems reviewed and are negative.  EKGs/Labs/Other Studies Reviewed:    The following studies were reviewed today: Cardiac Studies & Procedures   ______________________________________________________________________________________________ CARDIAC CATHETERIZATION  CARDIAC CATHETERIZATION 06/10/2015  Conclusion  Mid LAD-2 lesion, 95% stenosed. A 4.0 x 22 resolute drug-eluting stent was deployed. There is a 0% residual stenosis post intervention.  The left ventricular systolic function is normal.  Continue dual antiplatelets therapy for at least a year without interruption. He'll need aggressive secondary prevention. Will change his proton pump inhibitor to protonic due to the Plavix  that he is on. Stop Imdur  since his angina will likely be resolved with this intervention. He has other mild coronary disease. Try to keep blood pressure under control and LDL below 100. Dietary modifications as well. He'll be watched overnight.  Findings Coronary Findings Diagnostic  Dominance: Co-dominant  Left Anterior Descending The vessel is large .  First Diagonal Branch  Second Diagonal Branch The vessel is small in size.  Third Diagonal Branch The vessel is small in size.  Left Circumflex  First Obtuse Marginal Branch The vessel is small in size.  Right Coronary Artery  Intervention  Mid LAD-2 lesion PCI The pre-interventional distal flow is normal (TIMI 3). Pre-stent angioplasty was performed. A drug-eluting stent was placed. The strut is apposed. Post-stent angioplasty was performed. Maximum pressure: 18 atm. The post-interventional distal flow is normal (TIMI 3). The intervention was successful. No complications occurred at this lesion. Supplies used: STENT RESOLUTE INTEG 4.0X22; BALLOON [place] TREK  RX 4.0X12 There is a 0% residual stenosis post intervention.   STRESS TESTS  MYOCARDIAL PERFUSION IMAGING 06/08/2021  Interpretation Summary  The left ventricular ejection fraction is hyperdynamic (>65%).  Nuclear stress EF: 67%.  There was no ST segment deviation noted during stress.  No T wave inversion was noted during stress.  This is a low risk study.  The study is normal.  IMPRESSIONS Negative for stress induced arrhythmias. No evidence of ischemic or infarction.  RECOMMENDATIONS/CONCLUSIONS Stress test is negative. The study is consistent with a low risk study.   ECHOCARDIOGRAM  ECHOCARDIOGRAM COMPLETE 04/16/2020  Narrative ECHOCARDIOGRAM REPORT    Patient Name:   Marvin Soto  Date of Exam: 04/16/2020 Medical Rec #:  [id number]     Height:       72.0 in Accession #:    [id number]    Weight:       217.0 lb Date of Birth:  Oct 26, 1966     BSA:          2.206 m Patient Age:    54 years      BP:           122/78 mmHg Patient Gender: M             HR:           64 bpm. Exam Location:  [address]  Procedure: 2D Echo, Cardiac Doppler and Color Doppler  Indications:    I48.0  History:        Patient has no prior history of Echocardiogram examinations. CAD, Arrythmias:Atrial Fibrillation; Risk Factors:Hypertension and Dyslipidemia.  Sonographer:    Elsie Bohr RDCS Referring Phys: [phone] Marvin Soto  IMPRESSIONS   1. Left ventricular ejection fraction, by estimation, is 60 to 65%. The left ventricle has normal function. The left ventricle has no regional wall motion abnormalities. There is mild concentric left ventricular hypertrophy. Left ventricular diastolic parameters were normal. 2. Right ventricular systolic function is normal. The right ventricular size is normal. There is normal pulmonary artery systolic pressure. 3. The mitral valve is normal in structure. Trivial mitral valve regurgitation. No evidence of mitral stenosis. 4. The aortic  valve is tricuspid. Aortic valve regurgitation is trivial. No aortic stenosis is present. 5. The inferior vena cava is normal in size with greater than 50% respiratory variability, suggesting right atrial pressure of 3 mmHg.  FINDINGS Left Ventricle: Left ventricular ejection fraction, by estimation, is 60 to 65%. The left ventricle has normal function. The left ventricle has no regional wall motion abnormalities. The left ventricular internal cavity size was normal in size. There is mild concentric left ventricular hypertrophy. Left ventricular diastolic parameters were normal. Indeterminate filling pressures.  Right Ventricle: The right ventricular size is normal. No increase in right ventricular wall thickness. Right ventricular systolic function is normal. There is normal pulmonary artery systolic pressure. The tricuspid regurgitant velocity is 2.28 m/s, and with an assumed right atrial pressure of 3 mmHg, the estimated right ventricular systolic pressure is 23.8 mmHg.  Left Atrium: Left atrial size was normal in size.  Right Atrium: Right atrial size was normal in size.  Pericardium:  Trivial pericardial effusion is present.  Mitral Valve: The mitral valve is normal in structure. Normal mobility of the mitral valve leaflets. Trivial mitral valve regurgitation. No evidence of mitral valve stenosis.  Tricuspid Valve: The tricuspid valve is normal in structure. Tricuspid valve regurgitation is trivial. No evidence of tricuspid stenosis.  Aortic Valve: The aortic valve is tricuspid. Aortic valve regurgitation is trivial. No aortic stenosis is present.  Pulmonic Valve: The pulmonic valve was normal in structure. Pulmonic valve regurgitation is not visualized. No evidence of pulmonic stenosis.  Aorta: The aortic root is normal in size and structure.  Venous: The inferior vena cava is normal in size with greater than 50% respiratory variability, suggesting right atrial pressure of 3  mmHg.  IAS/Shunts: No atrial level shunt detected by color flow Doppler.   LEFT VENTRICLE PLAX 2D LVIDd:         4.50 cm  Diastology LVIDs:         3.00 cm  LV e' lateral:   10.80 cm/s LV PW:         1.10 cm  LV E/e' lateral: 8.8 LV IVS:        1.10 cm  LV e' medial:    9.03 cm/s LVOT diam:     2.20 cm  LV E/e' medial:  10.5 LV SV:         74 LV SV Index:   33 LVOT Area:     3.80 cm   RIGHT VENTRICLE             IVC RV S prime:     14.30 cm/s  IVC diam: 1.30 cm TAPSE (M-mode): 2.3 cm RVSP:           23.8 mmHg  LEFT ATRIUM             Index       RIGHT ATRIUM           Index LA diam:        3.20 cm 1.45 cm/m  RA Pressure: 3.00 mmHg LA Vol (A2C):   53.8 ml 24.39 ml/m RA Area:     10.80 cm LA Vol (A4C):   28.7 ml 13.01 ml/m RA Volume:   26.60 ml  12.06 ml/m LA Biplane Vol: 39.6 ml 17.95 ml/m AORTIC VALVE LVOT Vmax:   95.00 cm/s LVOT Vmean:  58.500 cm/s LVOT VTI:    0.194 m  AORTA Ao Root diam: 3.40 cm Ao Asc diam:  3.30 cm  MV E velocity: 95.20 cm/s  TRICUSPID VALVE MV A velocity: 67.80 cm/s  TR Peak grad:   20.8 mmHg MV E/A ratio:  1.40        TR Vmax:        228.00 cm/s Estimated RAP:  3.00 mmHg RVSP:           23.8 mmHg  SHUNTS Systemic VTI:  0.19 m Systemic Diam: 2.20 cm  Annabella Scarce MD Electronically signed by Annabella Scarce MD Signature Date/Time: 04/16/2020/12:08:38 PM    Final          ______________________________________________________________________________________________      EKG:   EKG Interpretation Date/Time:  Tuesday November 06 2024 08:19:49 EST Ventricular Rate:  79 PR Interval:  164 QRS Duration:  70 QT Interval:  348 QTC Calculation: 399 R Axis:   31  Text Interpretation: Normal sinus rhythm Normal ECG When compared with ECG of 16-Sep-2023 10:09, No significant change was found Confirmed by Wonda Sharper [phone]) on 11/06/2024 8:26:42 AM  Recent Labs: No results found for requested labs within last 365  days.  Recent Lipid Panel    Component Value Date/Time   CHOL 151 01/29/2020 1031   TRIG 134 01/29/2020 1031   HDL 36 (L) 01/29/2020 1031   CHOLHDL 4.2 01/29/2020 1031   CHOLHDL 3.4 02/23/2016 0802   VLDL 13 02/23/2016 0802   LDLCALC 91 01/29/2020 1031     Risk Assessment/Calculations:               Physical Exam:    VS:  BP 116/74 (BP Location: Left Arm, Patient Position: Sitting, Cuff Size: Large)   Pulse 79   Ht 5' 11 (1.803 m)   Wt 226 lb 12.8 oz (102.9 kg)   SpO2 99%   BMI 31.63 kg/m     Wt Readings from Last 3 Encounters:  11/06/24 226 lb 12.8 oz (102.9 kg)  09/27/24 222 lb (100.7 kg)  10/05/23 218 lb 6.4 oz (99.1 kg)     GEN:  Well nourished, well developed in no acute distress HEENT: Normal NECK: No JVD; No carotid bruits LYMPHATICS: No lymphadenopathy CARDIAC: RRR, no murmurs, rubs, gallops RESPIRATORY:  Clear to auscultation without rales, wheezing or rhonchi  ABDOMEN: Soft, non-tender, non-distended MUSCULOSKELETAL:  No edema; No deformity  SKIN: Warm and dry NEUROLOGIC:  Alert and oriented x 3 PSYCHIATRIC:  Normal affect   Assessment & Plan Paroxysmal atrial fibrillation (HCC) Continue rivaroxaban . Stop ASA. Follow-up EP one year as planned. Coronary artery disease involving native coronary artery of native heart with angina pectoris Pt having angina when converting from AF-->sinus. Hx of LAD stenting. Recommend exercise Myoview  stress test for further evaluation. Continue metoprolol , losartan , atorvastatin . Essential hypertension BP under optimal control. Most recent labs reviewed (creatinine 0.97, K 4.5). continue current Rx.  Mixed hyperlipidemia LDL above goal. Continue atorvastatin  40 mg daily. Add zetia  10 mg daily. Follow-up labs 3 months. Discussed diet/lifestyle modification.  Medication management  Coronary artery disease due to lipid rich plaque        Informed Consent   Shared Decision Making/Informed Consent The risks  [chest pain, shortness of breath, cardiac arrhythmias, dizziness, blood pressure fluctuations, myocardial infarction, stroke/transient ischemic attack, nausea, vomiting, allergic reaction, radiation exposure, metallic taste sensation and life-threatening complications (estimated to be 1 in 10,000)], benefits (risk stratification, diagnosing coronary artery disease, treatment guidance) and alternatives of a nuclear stress test were discussed in detail with Mr. Doolen and he agrees to proceed.       Medication Adjustments/Labs and Tests Ordered: Current medicines are reviewed at length with the patient today.  Concerns regarding medicines are outlined above.  Orders Placed This Encounter  Procedures   Lipid panel   Hepatic function panel   Cardiac Stress Test: Informed Consent Details: Physician/Practitioner Attestation; Transcribe to consent form and obtain patient signature   MYOCARDIAL PERFUSION IMAGING   EKG 12-Lead   Meds ordered this encounter  Medications   ezetimibe  (ZETIA ) 10 MG tablet    Sig: Take 1 tablet (10 mg total) by mouth daily.    Dispense:  90 tablet    Refill:  3    Patient Instructions  Medication Instructions:  Please discontinue your Aspirin . Start Zetia  10 mg daily. Continue all other medications as listed.  *If you need a refill on your cardiac medications before your next appointment, please call your pharmacy*  Lab Work: Please have blood work in 3 months at any [facility] (Conservation officer, nature)  If you have labs (blood work) drawn today and  your tests are completely normal, you will receive your results only by: MyChart Message (if you have MyChart) OR A paper copy in the mail If you have any lab test that is abnormal or we need to change your treatment, we will call you to review the results.  Testing/Procedures: You are scheduled for a Myocardial Perfusion Imaging Study on      at     .   Please arrive 15 minutes prior to your appointment time for  registration and insurance purposes.   The test will take approximately 3 to 4 hours to complete; you may bring reading material. If someone comes with you to your appointment, they will need to remain in the main lobby due to limited space in the testing area.   If you are pregnant or breastfeeding, please notify the nuclear lab prior to your appointment.   How to prepare for your Myocardial Perfusion test:   Do not eat or drink 3 hours prior to your test, except you may have water.    Do not consume products containing caffeine (regular or decaffeinated) 12 hours prior to your test (ex: coffee, chocolate, soda, tea)   Do bring a list of your current medications with you. If not listed below, you may take your medications as normal.   Bring any held medication to your appointment, as you may be required to take it once the test is complete.   Do wear comfortable clothes (no dresses or overalls) and walking shoes. Tennis shoes are preferred. No heels or open toed shoes.  Do not wear cologne, perfume, aftershave or lotions (deodorant is allowed).   If these instructions are not followed, you test will have to be rescheduled.   Please report to [address] floor, [place] for your test. If you have questions or concerns about your appointment, please call the Nuclear Lab at #[phone].  If you cannot keep your appointment, please provide 24 hour notification to the Nuclear lab to avoid a possible $50 charge to your account.     Follow-Up: At [facility], you and your health needs are our priority.  As part of our continuing mission to provide you with exceptional heart care, our providers are all part of one team.  This team includes your primary Cardiologist (physician) and Advanced Practice Providers or APPs (Physician Assistants and Nurse Practitioners) who all work together to provide you with the care you need, when you need it.  Your next appointment:   1  year(s)  Provider:   Dr Wonda     We recommend signing up for the patient portal called MyChart.  Sign up information is provided on this After Visit Summary.  MyChart is used to connect with patients for Virtual Visits (Telemedicine).  Patients are able to view lab/test results, encounter notes, upcoming appointments, etc.  Non-urgent messages can be sent to your provider as well.   To learn more about what you can do with MyChart, go to [email or web address].             Signed, Marvin Wonda, MD  11/06/2024 9:46 AM    [facility]

## 2024-11-06 NOTE — Assessment & Plan Note (Signed)
 BP under optimal control. Most recent labs reviewed (creatinine 0.97, K 4.5). continue current Rx.

## 2024-11-06 NOTE — Patient Instructions (Signed)
 Medication Instructions:  Please discontinue your Aspirin . Start Zetia  10 mg daily. Continue all other medications as listed.  *If you need a refill on your cardiac medications before your next appointment, please call your pharmacy*  Lab Work: Please have blood work in 3 months at any [facility] (Conservation officer, nature)  If you have labs (blood work) drawn today and your tests are completely normal, you will receive your results only by: MyChart Message (if you have MyChart) OR A paper copy in the mail If you have any lab test that is abnormal or we need to change your treatment, we will call you to review the results.  Testing/Procedures: You are scheduled for a Myocardial Perfusion Imaging Study on      at     .   Please arrive 15 minutes prior to your appointment time for registration and insurance purposes.   The test will take approximately 3 to 4 hours to complete; you may bring reading material. If someone comes with you to your appointment, they will need to remain in the main lobby due to limited space in the testing area.   If you are pregnant or breastfeeding, please notify the nuclear lab prior to your appointment.   How to prepare for your Myocardial Perfusion test:   Do not eat or drink 3 hours prior to your test, except you may have water.    Do not consume products containing caffeine (regular or decaffeinated) 12 hours prior to your test (ex: coffee, chocolate, soda, tea)   Do bring a list of your current medications with you. If not listed below, you may take your medications as normal.   Bring any held medication to your appointment, as you may be required to take it once the test is complete.   Do wear comfortable clothes (no dresses or overalls) and walking shoes. Tennis shoes are preferred. No heels or open toed shoes.  Do not wear cologne, perfume, aftershave or lotions (deodorant is allowed).   If these instructions are not followed, you test will have to be  rescheduled.   Please report to [address], [address] floor, [place] for your test. If you have questions or concerns about your appointment, please call the Nuclear Lab at #[phone].  If you cannot keep your appointment, please provide 24 hour notification to the Nuclear lab to avoid a possible $50 charge to your account.     Follow-Up: At [facility], you and your health needs are our priority.  As part of our continuing mission to provide you with exceptional heart care, our providers are all part of one team.  This team includes your primary Cardiologist (physician) and Advanced Practice Providers or APPs (Physician Assistants and Nurse Practitioners) who all work together to provide you with the care you need, when you need it.  Your next appointment:   1 year(s)  Provider:   Dr Wonda     We recommend signing up for the patient portal called MyChart.  Sign up information is provided on this After Visit Summary.  MyChart is used to connect with patients for Virtual Visits (Telemedicine).  Patients are able to view lab/test results, encounter notes, upcoming appointments, etc.  Non-urgent messages can be sent to your provider as well.   To learn more about what you can do with MyChart, go to [email or web address].

## 2024-11-06 NOTE — Assessment & Plan Note (Signed)
 LDL above goal. Continue atorvastatin  40 mg daily. Add zetia  10 mg daily. Follow-up labs 3 months. Discussed diet/lifestyle modification.

## 2024-11-08 ENCOUNTER — Telehealth (HOSPITAL_COMMUNITY): Payer: Self-pay | Admitting: *Deleted

## 2024-11-08 NOTE — Telephone Encounter (Signed)
 Left a detailed message with instructions on voicemail as a reminder about a STRESS TEST on 11/16/24 at 7:30. I also reminded patient not to take his Metoprolol  for 24 hours before the day of his test.

## 2024-11-14 ENCOUNTER — Other Ambulatory Visit: Payer: Self-pay | Admitting: Cardiovascular Disease

## 2024-11-14 DIAGNOSIS — I2510 Atherosclerotic heart disease of native coronary artery without angina pectoris: Secondary | ICD-10-CM

## 2024-11-16 ENCOUNTER — Ambulatory Visit (HOSPITAL_COMMUNITY)
Admission: RE | Admit: 2024-11-16 | Discharge: 2024-11-16 | Disposition: A | Discharge status: Home / Self Care | Source: Ambulatory Visit | Attending: Cardiovascular Disease | Admitting: Cardiovascular Disease

## 2024-11-16 ENCOUNTER — Other Ambulatory Visit: Payer: Self-pay

## 2024-11-16 DIAGNOSIS — I2510 Atherosclerotic heart disease of native coronary artery without angina pectoris: Secondary | ICD-10-CM | POA: Insufficient documentation

## 2024-11-16 DIAGNOSIS — I2583 Coronary atherosclerosis due to lipid rich plaque: Secondary | ICD-10-CM | POA: Insufficient documentation

## 2024-11-16 DIAGNOSIS — I480 Paroxysmal atrial fibrillation: Secondary | ICD-10-CM

## 2024-11-16 DIAGNOSIS — R9439 Abnormal result of other cardiovascular function study: Secondary | ICD-10-CM

## 2024-11-16 MED ORDER — TECHNETIUM TC 99M TETROFOSMIN IV KIT
11.0000 | PACK | Freq: Once | INTRAVENOUS | Status: AC | PRN
  Administered 2024-11-16: 11 via INTRAVENOUS

## 2024-11-16 MED ORDER — TECHNETIUM TC 99M TETROFOSMIN IV KIT: 32.6000 | PACK | Freq: Once | INTRAVENOUS | Status: DC | PRN

## 2024-11-19 ENCOUNTER — Ambulatory Visit (HOSPITAL_COMMUNITY)
Admission: RE | Admit: 2024-11-19 | Discharge: 2024-11-19 | Disposition: A | Discharge status: Home / Self Care | Source: Ambulatory Visit | Attending: Cardiovascular Disease | Admitting: Cardiovascular Disease

## 2024-11-19 DIAGNOSIS — I480 Paroxysmal atrial fibrillation: Secondary | ICD-10-CM

## 2024-11-19 DIAGNOSIS — R9439 Abnormal result of other cardiovascular function study: Secondary | ICD-10-CM

## 2024-11-19 DIAGNOSIS — I2510 Atherosclerotic heart disease of native coronary artery without angina pectoris: Secondary | ICD-10-CM | POA: Diagnosis not present

## 2024-11-19 MED ORDER — REGADENOSON 0.4 MG/5ML IV SOLN
0.4000 mg | Freq: Once | INTRAVENOUS | Status: AC
  Administered 2024-11-19: 0.4 mg via INTRAVENOUS

## 2024-11-19 MED ORDER — REGADENOSON 0.4 MG/5ML IV SOLN
INTRAVENOUS | Status: AC
  Filled 2024-11-19: qty 5

## 2024-11-19 MED ORDER — TECHNETIUM TC 99M TETROFOSMIN IV KIT
32.1000 | PACK | Freq: Once | INTRAVENOUS | Status: AC | PRN
  Administered 2024-11-19: 32.1 via INTRAVENOUS

## 2024-11-20 LAB — MYOCARDIAL PERFUSION IMAGING
LV dias vol: 98 mL (ref 62–150)
LV sys vol: 33 mL
Nuc Stress EF: 66 %
Peak HR: 93 {beats}/min
Rest HR: 63 {beats}/min
Rest Nuclear Isotope Dose: 11 mCi
SDS: 0
SRS: 0
SSS: 0
ST Depression (mm): 0 mm
Stress Nuclear Isotope Dose: 32.6 mCi
TID: 1.08

## 2024-11-23 ENCOUNTER — Ambulatory Visit: Payer: Self-pay | Admitting: Cardiovascular Disease
